# Patient Record
Sex: Female | Born: 1970 | Race: Black or African American | Hispanic: No | Marital: Single | Smoking: Never smoker
Health system: Southern US, Community
[De-identification: ages and names within clinical notes are randomized; demographics above are authoritative.]

## PROBLEM LIST (undated history)

## (undated) DIAGNOSIS — G8929 Other chronic pain: Secondary | ICD-10-CM

## (undated) DIAGNOSIS — M25562 Pain in left knee: Secondary | ICD-10-CM

## (undated) DIAGNOSIS — R413 Other amnesia: Secondary | ICD-10-CM

## (undated) DIAGNOSIS — E785 Hyperlipidemia, unspecified: Secondary | ICD-10-CM

## (undated) DIAGNOSIS — Z6838 Body mass index (BMI) 38.0-38.9, adult: Secondary | ICD-10-CM

## (undated) DIAGNOSIS — M25571 Pain in right ankle and joints of right foot: Secondary | ICD-10-CM

## (undated) DIAGNOSIS — N924 Excessive bleeding in the premenopausal period: Secondary | ICD-10-CM

## (undated) DIAGNOSIS — I10 Essential (primary) hypertension: Secondary | ICD-10-CM

## (undated) DIAGNOSIS — Z780 Asymptomatic menopausal state: Secondary | ICD-10-CM

## (undated) HISTORY — DX: Pain in right ankle and joints of right foot: M25.571

## (undated) HISTORY — DX: Excessive bleeding in the premenopausal period: N92.4

## (undated) HISTORY — DX: Hyperlipidemia, unspecified: E78.5

## (undated) HISTORY — DX: Asymptomatic menopausal state: Z78.0

## (undated) HISTORY — DX: Other chronic pain: G89.29

## (undated) HISTORY — DX: Pain in left knee: M25.562

## (undated) HISTORY — DX: Essential (primary) hypertension: I10

## (undated) HISTORY — DX: Other amnesia: R41.3

## (undated) HISTORY — DX: Body mass index (bmi) 38.0-38.9, adult: Z68.38

---

## 2014-04-07 DIAGNOSIS — Z6838 Body mass index (BMI) 38.0-38.9, adult: Secondary | ICD-10-CM

## 2014-04-07 DIAGNOSIS — E782 Mixed hyperlipidemia: Secondary | ICD-10-CM | POA: Insufficient documentation

## 2014-04-07 DIAGNOSIS — N924 Excessive bleeding in the premenopausal period: Secondary | ICD-10-CM

## 2014-04-07 DIAGNOSIS — I10 Essential (primary) hypertension: Secondary | ICD-10-CM | POA: Insufficient documentation

## 2014-04-07 HISTORY — DX: Body mass index (BMI) 38.0-38.9, adult: Z68.38

## 2014-04-07 HISTORY — DX: Excessive bleeding in the premenopausal period: N92.4

## 2014-04-16 ENCOUNTER — Ambulatory Visit
Admit: 2014-04-16 | Disposition: A | Discharge status: Home / Self Care | Payer: Self-pay | Attending: Internal Medicine | Admitting: Internal Medicine

## 2014-09-24 DIAGNOSIS — L700 Acne vulgaris: Secondary | ICD-10-CM | POA: Insufficient documentation

## 2016-03-03 ENCOUNTER — Other Ambulatory Visit: Payer: Self-pay | Admitting: Internal Medicine

## 2016-03-03 DIAGNOSIS — R413 Other amnesia: Secondary | ICD-10-CM

## 2016-03-03 DIAGNOSIS — Z1239 Encounter for other screening for malignant neoplasm of breast: Secondary | ICD-10-CM

## 2016-03-03 DIAGNOSIS — N951 Menopausal and female climacteric states: Secondary | ICD-10-CM | POA: Insufficient documentation

## 2016-03-03 HISTORY — DX: Other amnesia: R41.3

## 2016-03-03 LAB — HM PAP SMEAR: HM Pap smear: NEGATIVE

## 2016-03-31 ENCOUNTER — Ambulatory Visit: Payer: Self-pay

## 2016-04-13 ENCOUNTER — Ambulatory Visit
Admission: RE | Admit: 2016-04-13 | Discharge: 2016-04-13 | Disposition: A | Discharge status: Home / Self Care | Payer: BLUE CROSS/BLUE SHIELD | Source: Ambulatory Visit | Attending: Internal Medicine | Admitting: Internal Medicine

## 2016-04-13 ENCOUNTER — Encounter: Payer: Self-pay | Admitting: Radiology

## 2016-04-13 DIAGNOSIS — Z1231 Encounter for screening mammogram for malignant neoplasm of breast: Secondary | ICD-10-CM | POA: Insufficient documentation

## 2016-04-13 DIAGNOSIS — Z1239 Encounter for other screening for malignant neoplasm of breast: Secondary | ICD-10-CM

## 2017-05-23 DIAGNOSIS — Z780 Asymptomatic menopausal state: Secondary | ICD-10-CM

## 2017-05-23 HISTORY — DX: Asymptomatic menopausal state: Z78.0

## 2017-05-23 LAB — TSH: TSH: 1.12 (ref 0.41–5.90)

## 2017-05-23 LAB — LIPID PANEL
CHOLESTEROL: 185 (ref 0–200)
HDL: 50 (ref 35–70)
LDL CALC: 115
Triglycerides: 102 (ref 40–160)

## 2017-05-23 LAB — BASIC METABOLIC PANEL: Glucose: 90

## 2017-05-30 ENCOUNTER — Other Ambulatory Visit: Payer: Self-pay | Admitting: Internal Medicine

## 2017-05-30 DIAGNOSIS — E269 Hyperaldosteronism, unspecified: Secondary | ICD-10-CM

## 2017-06-12 ENCOUNTER — Ambulatory Visit: Admission: RE | Admit: 2017-06-12 | Payer: BC Managed Care – PPO | Source: Ambulatory Visit

## 2017-06-13 LAB — HM DEXA SCAN: HM DEXA SCAN: NORMAL

## 2018-03-28 ENCOUNTER — Other Ambulatory Visit: Payer: Self-pay

## 2018-03-28 ENCOUNTER — Ambulatory Visit: Payer: BC Managed Care – PPO | Admitting: Family Medicine

## 2018-03-28 ENCOUNTER — Encounter: Payer: Self-pay | Admitting: Family Medicine

## 2018-03-28 VITALS — BP 148/90 | HR 96 | Temp 98.3°F | Resp 16 | Ht 68.0 in | Wt 243.3 lb

## 2018-03-28 DIAGNOSIS — Z1239 Encounter for other screening for malignant neoplasm of breast: Secondary | ICD-10-CM

## 2018-03-28 DIAGNOSIS — Z113 Encounter for screening for infections with a predominantly sexual mode of transmission: Secondary | ICD-10-CM

## 2018-03-28 DIAGNOSIS — Z114 Encounter for screening for human immunodeficiency virus [HIV]: Secondary | ICD-10-CM

## 2018-03-28 DIAGNOSIS — Z23 Encounter for immunization: Secondary | ICD-10-CM

## 2018-03-28 DIAGNOSIS — Z131 Encounter for screening for diabetes mellitus: Secondary | ICD-10-CM

## 2018-03-28 DIAGNOSIS — E782 Mixed hyperlipidemia: Secondary | ICD-10-CM

## 2018-03-28 DIAGNOSIS — R799 Abnormal finding of blood chemistry, unspecified: Secondary | ICD-10-CM

## 2018-03-28 DIAGNOSIS — Z780 Asymptomatic menopausal state: Secondary | ICD-10-CM

## 2018-03-28 DIAGNOSIS — F341 Dysthymic disorder: Secondary | ICD-10-CM

## 2018-03-28 DIAGNOSIS — R7989 Other specified abnormal findings of blood chemistry: Secondary | ICD-10-CM

## 2018-03-28 DIAGNOSIS — E669 Obesity, unspecified: Secondary | ICD-10-CM

## 2018-03-28 DIAGNOSIS — I10 Essential (primary) hypertension: Secondary | ICD-10-CM

## 2018-03-28 MED ORDER — AMLODIPINE BESYLATE 5 MG PO TABS: 5.0000 mg | ORAL_TABLET | Freq: Every day | ORAL | 0 refills | Status: DC

## 2018-03-28 MED ORDER — SPIRONOLACTONE 25 MG PO TABS: 25.0000 mg | ORAL_TABLET | Freq: Every day | ORAL | 0 refills | Status: DC

## 2018-03-28 NOTE — Progress Notes (Signed)
Name: Cristina Bridges   MRN: [id number]    DOB: September 01, 1970   Date:03/28/2018       Progress Note  Subjective  Chief Complaint  Chief Complaint  Patient presents with  . Establish Care  . Hypertension    Denies any symptoms    HPI  New patient: used to see Dr. Sedonia Small at [facility] but had to switch providers because of new insurance  Dysthymia: she has post-partum depression but otherwise not problems with mood and never took medications for depression. She states not happy with her current relationship. She worries that he is not being faithful. They have been dating for the past 4 years but he moved in with her about 6 months ago and he is always gone, it makes her feel insecure and has affecting her mood, she has noticed that she is sad, eating more, gaining weight , phq 9 is high. Discussed counseling and talk to him.   Acne vulgaris: used to see Dermatologist but missed appointment, out of spirnolactone and also creams, discussed better diet  HTN: she skips medications because it causes fatigue and dizziness, we will decrease dose of norvasc, reviewed records and renin/aldosterone ratio was abnormal last year, bp was up and CT ordered but not done because it was not covered by insurance. We will recheck levels. No chest pain or palpitation.   Obesity: she has not been active, eating fast food, drinking sodas, sweet tea . She will change her diet and start exercising again.   Dyslipidemia: not on medication, we will recheck labs.    Patient Active Problem List   Diagnosis Date Noted  . Postmenopausal 05/23/2017  . Hot flashes, menopausal 03/03/2016  . Acne vulgaris 09/24/2014  . Benign essential hypertension 04/07/2014  . Body mass index (BMI) of 38.0-38.9 in adult 04/07/2014  . Mixed hyperlipidemia 04/07/2014    Past Surgical History:  Procedure Laterality Date  . CESAREAN SECTION  05/29/2000   Age-12    Family History  Problem Relation Age of Onset  . Diabetes  Mellitus II Mother   . Hypertension Mother   . Bone cancer Father        Remission  . Prostate cancer Father        Remission  . Hypertension Sister   . Cancer Paternal Grandmother   . Cancer Paternal Grandfather        Maybe Lung    Social History   Socioeconomic History  . Marital status: Media planner    Spouse name: Not on file  . Number of children: 1  . Years of education: Not on file  . Highest education level: Associate degree: academic program  Occupational History  . Occupation: Higher education careers adviser  . Financial resource strain: Not hard at all  . Food insecurity:    Worry: Never true    Inability: Never true  . Transportation needs:    Medical: No    Non-medical: No  Tobacco Use  . Smoking status: Never Smoker  . Smokeless tobacco: Never Used  Substance and Sexual Activity  . Alcohol use: Yes    Alcohol/week: 1.0 standard drinks    Types: 1 Glasses of wine per week    Comment: occasionally  . Drug use: Never  . Sexual activity: Yes    Partners: Male    Birth control/protection: Post-menopausal  Lifestyle  . Physical activity:    Days per week: 0 days    Minutes per session: 0 min  .  Stress: Only a little  Relationships  . Social connections:    Talks on phone: Three times a week    Gets together: Once a week    Attends religious service: More than 4 times per year    Active member of club or organization: No    Attends meetings of clubs or organizations: Never    Relationship status: Never married  . Intimate partner violence:    Fear of current or ex partner: No    Emotionally abused: No    Physically abused: No    Forced sexual activity: No  Other Topics Concern  . Not on file  Social History Narrative   She works at [employer] at [employer], 48 yo at home.    Boyfriend moved in with them 4 years ago and relationship is going down hill.      Current Outpatient Medications:  .  amLODipine (NORVASC) 10 MG tablet, Take by mouth., Disp: , Rfl:   .  tretinoin (RETIN-A) 0.025 % cream, nightly., Disp: , Rfl:  .  diclofenac (VOLTAREN) 75 MG EC tablet, Take by mouth., Disp: , Rfl:  .  felodipine (PLENDIL) 10 MG 24 hr tablet, Take by mouth., Disp: , Rfl:  .  spironolactone (ALDACTONE) 50 MG tablet, Take one tablet by mouth daily, Disp: , Rfl:   No Known Allergies  I personally reviewed active problem list, medication list, allergies, family history, social history with the patient/caregiver today.   ROS  Constitutional: Negative for fever or weight change.  Respiratory: Negative for cough and shortness of breath.   Cardiovascular: Negative for chest pain or palpitations.  Gastrointestinal: Negative for abdominal pain, no bowel changes.  Musculoskeletal: Negative for gait problem or joint swelling.  Skin: Negative for rash. Acne is worse  Neurological: Negative for dizziness or headache.  No other specific complaints in a complete review of systems (except as listed in HPI above).  Objective  Vitals:   03/28/18 0938  BP: (!) 148/90  Pulse: 96  Resp: 16  Temp: 98.3 F (36.8 C)  TempSrc: Oral  SpO2: 98%  Weight: 243 lb 4.8 oz (110.4 kg)  Height: 5\' 8"  (1.727 m)    Body mass index is 36.99 kg/m.  Physical Exam  Constitutional: Patient appears well-developed and well-nourished. Obese  No distress.  HEENT: head atraumatic, normocephalic, pupils equal and reactive to light, neck supple, throat within normal limits Cardiovascular: Normal rate, regular rhythm and normal heart sounds.  No murmur heard. No BLE edema. Pulmonary/Chest: Effort normal and breath sounds normal. No respiratory distress. Abdominal: Soft.  There is no tenderness. Psychiatric: Patient has a normal mood and affect. behavior is normal. Judgment and thought content normal.  PHQ2/9: Depression screen PHQ 2/9 03/28/2018  Decreased Interest 1  Down, Depressed, Hopeless 1  PHQ - 2 Score 2  Altered sleeping 2  Tired, decreased energy 3  Change in  appetite 3  Feeling bad or failure about yourself  0  Trouble concentrating 1  Moving slowly or fidgety/restless 0  Suicidal thoughts 0  PHQ-9 Score 11  Difficult doing work/chores Somewhat difficult   phq9   Fall Risk: Fall Risk  03/28/2018  Falls in the past year? 0  Number falls in past yr: 0  Injury with Fall? 0     Functional Status Survey: Is the patient deaf or have difficulty hearing?: No Does the patient have difficulty seeing, even when wearing glasses/contacts?: Yes(contacts) Does the patient have difficulty concentrating, remembering, or making decisions?: Yes(remembering) Does  the patient have difficulty walking or climbing stairs?: No Does the patient have difficulty dressing or bathing?: No Does the patient have difficulty doing errands alone such as visiting a doctor's office or shopping?: No    Assessment & Plan  1. Benign essential hypertension  - Urine Microalbumin w/creat. ratio - Aldosterone + renin activity w/ ratio - CBC with Differential/Platelet - COMPLETE METABOLIC PANEL WITH GFR - amLODipine (NORVASC) 5 MG tablet; Take 1 tablet (5 mg total) by mouth daily.  Dispense: 30 tablet; Refill: 0 - spironolactone (ALDACTONE) 25 MG tablet; Take 1 tablet (25 mg total) by mouth daily.  Dispense: 30 tablet; Refill: 0  2. Encounter for screening for HIV  - HIV Antibody (routine testing w rflx)  3. Flu vaccine need  - Flu Vaccine QUAD 36+ mos IM  4. Breast cancer screening  - MM 3D SCREEN BREAST BILATERAL  5. Obesity (BMI 35.0-39.9 without comorbidity)  Discussed with the patient the risk posed by an increased BMI. Discussed importance of portion control, calorie counting and at least 150 minutes of physical activity weekly. Avoid sweet beverages and drink more water. Eat at least 6 servings of fruit and vegetables daily   6. Mixed hyperlipidemia  - Lipid panel  7. Postmenopausal   8. 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 Diabetes mellitus screening  - Hemoglobin A1c  10. Routine screening for STI (sexually transmitted infection)  - Hepatitis panel, acute - RPR  11. Dysthymia  She will try talking to boyfriend and see if he stays home more otherwise she will ask him to leave her house   12. Morbid obesity (HCC)  Discussed with the patient the risk posed by an increased BMI. Discussed importance of portion control, calorie counting and at least 150 minutes of physical activity weekly. Avoid sweet beverages and drink more water. Eat at least 6 servings of fruit and vegetables daily

## 2018-04-02 ENCOUNTER — Other Ambulatory Visit: Payer: Self-pay | Admitting: Family Medicine

## 2018-04-02 ENCOUNTER — Encounter: Payer: Self-pay | Admitting: Family Medicine

## 2018-04-02 DIAGNOSIS — R7989 Other specified abnormal findings of blood chemistry: Secondary | ICD-10-CM

## 2018-04-02 DIAGNOSIS — R809 Proteinuria, unspecified: Secondary | ICD-10-CM | POA: Insufficient documentation

## 2018-04-02 DIAGNOSIS — I10 Essential (primary) hypertension: Secondary | ICD-10-CM

## 2018-04-02 DIAGNOSIS — R799 Abnormal finding of blood chemistry, unspecified: Secondary | ICD-10-CM

## 2018-04-02 LAB — ALDOSTERONE + RENIN ACTIVITY W/ RATIO
ALDO / PRA Ratio: 84.6 Ratio — ABNORMAL HIGH (ref 0.9–28.9)
Aldosterone: 11 ng/dL
RENIN ACTIVITY: 0.13 ng/mL/h — AB (ref 0.25–5.82)

## 2018-04-02 LAB — CBC WITH DIFFERENTIAL/PLATELET
ABSOLUTE MONOCYTES: 488 {cells}/uL (ref 200–950)
BASOS ABS: 21 {cells}/uL (ref 0–200)
BASOS PCT: 0.4 %
Eosinophils Absolute: 122 cells/uL (ref 15–500)
Eosinophils Relative: 2.3 %
HEMATOCRIT: 41.7 % (ref 35.0–45.0)
HEMOGLOBIN: 14 g/dL (ref 11.7–15.5)
Lymphs Abs: 2131 cells/uL (ref 850–3900)
MCH: 30.2 pg (ref 27.0–33.0)
MCHC: 33.6 g/dL (ref 32.0–36.0)
MCV: 90.1 fL (ref 80.0–100.0)
MPV: 11.5 fL (ref 7.5–12.5)
Monocytes Relative: 9.2 %
Neutro Abs: 2539 cells/uL (ref 1500–7800)
Neutrophils Relative %: 47.9 %
Platelets: 338 10*3/uL (ref 140–400)
RBC: 4.63 10*6/uL (ref 3.80–5.10)
RDW: 13.2 % (ref 11.0–15.0)
Total Lymphocyte: 40.2 %
WBC: 5.3 10*3/uL (ref 3.8–10.8)

## 2018-04-02 LAB — HEMOGLOBIN A1C
EAG (MMOL/L): 6.5 (calc)
Hgb A1c MFr Bld: 5.7 % of total Hgb — ABNORMAL HIGH (ref <5.7)
MEAN PLASMA GLUCOSE: 117 (calc)

## 2018-04-02 LAB — HEPATITIS PANEL, ACUTE
HEP B C IGM: NONREACTIVE
HEP B S AG: NONREACTIVE
Hep A IgM: NONREACTIVE
Hepatitis C Ab: NONREACTIVE
SIGNAL TO CUT-OFF: 0.06 (ref <1.00)

## 2018-04-02 LAB — LIPID PANEL
CHOLESTEROL: 174 mg/dL (ref <200)
HDL: 54 mg/dL (ref >=50)
LDL Cholesterol (Calc): 97 mg/dL (calc)
Non-HDL Cholesterol (Calc): 120 mg/dL (calc) (ref <130)
Total CHOL/HDL Ratio: 3.2 (calc) (ref <5.0)
Triglycerides: 125 mg/dL (ref <150)

## 2018-04-02 LAB — COMPLETE METABOLIC PANEL WITH GFR
AG Ratio: 1.7 (calc) (ref 1.0–2.5)
ALBUMIN MSPROF: 4.4 g/dL (ref 3.6–5.1)
ALKALINE PHOSPHATASE (APISO): 87 U/L (ref 31–125)
ALT: 22 U/L (ref 6–29)
AST: 24 U/L (ref 10–35)
BILIRUBIN TOTAL: 0.4 mg/dL (ref 0.2–1.2)
BUN: 10 mg/dL (ref 7–25)
CHLORIDE: 105 mmol/L (ref 98–110)
CO2: 28 mmol/L (ref 20–32)
Calcium: 9.5 mg/dL (ref 8.6–10.2)
Creat: 0.81 mg/dL (ref 0.50–1.10)
GFR, Est African American: 99 mL/min/{1.73_m2} (ref >=60)
GFR, Est Non African American: 85 mL/min/{1.73_m2} (ref >=60)
GLOBULIN: 2.6 g/dL (ref 1.9–3.7)
GLUCOSE: 84 mg/dL (ref 65–99)
Potassium: 4.2 mmol/L (ref 3.5–5.3)
Sodium: 142 mmol/L (ref 135–146)
Total Protein: 7 g/dL (ref 6.1–8.1)

## 2018-04-02 LAB — MICROALBUMIN / CREATININE URINE RATIO
CREATININE, URINE: 217 mg/dL (ref 20–275)
Microalb Creat Ratio: 42 mcg/mg creat — ABNORMAL HIGH (ref <30)
Microalb, Ur: 9.1 mg/dL

## 2018-04-02 LAB — RPR: RPR Ser Ql: NONREACTIVE

## 2018-04-02 LAB — HIV ANTIBODY (ROUTINE TESTING W REFLEX): HIV: NONREACTIVE

## 2018-04-09 ENCOUNTER — Telehealth: Payer: Self-pay | Admitting: Family Medicine

## 2018-04-09 NOTE — Telephone Encounter (Signed)
Copied from CRM [id number]. Topic: Quick Communication - See Telephone Encounter >> Apr 09, 2018  4:13 PM Jay Schlichter wrote: CRM for notification. See Telephone encounter for: 04/09/18. Ct Abdomen has been denied.  Peer to peer is needed.  Please call  [phone] Policy number is [phone] Closes on 04/10/18 at 4pm

## 2018-04-10 NOTE — Telephone Encounter (Signed)
Release ID #[id number]  Per the request of Dr. Alba Cory, information will be faxed to [facility] Medical Review Board at [phone] after she speaks with the review board.

## 2018-04-10 NOTE — Telephone Encounter (Signed)
Approved from 04/06/2018 till 10/02/2018  [phone]

## 2018-04-20 ENCOUNTER — Ambulatory Visit: Payer: BC Managed Care – PPO

## 2018-05-04 ENCOUNTER — Other Ambulatory Visit: Payer: Self-pay | Admitting: Family Medicine

## 2018-05-04 DIAGNOSIS — I10 Essential (primary) hypertension: Secondary | ICD-10-CM

## 2018-05-04 NOTE — Telephone Encounter (Signed)
Refill request for Hypertension medication:  Amlodipine 5 mg  Spironolactone 25 mg  Last office visit pertaining to hypertension: 03/28/2018  BP Readings from Last 3 Encounters:  03/28/18 (!) 148/90     Lab Results  Component Value Date   CREATININE 0.81 03/28/2018   BUN 10 03/28/2018   NA 142 03/28/2018   K 4.2 03/28/2018   CL 105 03/28/2018   CO2 28 03/28/2018   Follow-ups on file. 05/09/2018

## 2018-05-09 ENCOUNTER — Encounter: Payer: BC Managed Care – PPO | Admitting: Family Medicine

## 2018-05-25 ENCOUNTER — Other Ambulatory Visit: Payer: Self-pay

## 2018-05-25 ENCOUNTER — Ambulatory Visit (INDEPENDENT_AMBULATORY_CARE_PROVIDER_SITE_OTHER): Payer: BC Managed Care – PPO | Admitting: Family Medicine

## 2018-05-25 ENCOUNTER — Encounter: Payer: Self-pay | Admitting: Family Medicine

## 2018-05-25 VITALS — BP 126/78 | HR 88 | Temp 98.8°F | Resp 12 | Ht 69.0 in | Wt 248.6 lb

## 2018-05-25 DIAGNOSIS — R799 Abnormal finding of blood chemistry, unspecified: Secondary | ICD-10-CM

## 2018-05-25 DIAGNOSIS — I10 Essential (primary) hypertension: Secondary | ICD-10-CM | POA: Diagnosis not present

## 2018-05-25 DIAGNOSIS — Z1211 Encounter for screening for malignant neoplasm of colon: Secondary | ICD-10-CM

## 2018-05-25 DIAGNOSIS — Z01419 Encounter for gynecological examination (general) (routine) without abnormal findings: Secondary | ICD-10-CM

## 2018-05-25 DIAGNOSIS — R7989 Other specified abnormal findings of blood chemistry: Secondary | ICD-10-CM

## 2018-05-25 MED ORDER — AMLODIPINE BESYLATE 5 MG PO TABS: 5.0000 mg | ORAL_TABLET | Freq: Every day | ORAL | 0 refills | Status: DC

## 2018-05-25 MED ORDER — SPIRONOLACTONE 25 MG PO TABS: 25.0000 mg | ORAL_TABLET | Freq: Every day | ORAL | 0 refills | Status: DC

## 2018-05-25 NOTE — Progress Notes (Signed)
Name: Cristina Bridges   MRN: [id number]    DOB: 06/12/1970   Date:05/25/2018       Progress Note  Subjective  Chief Complaint  Chief Complaint  Patient presents with  . Annual Exam    HPI   Patient presents for annual CPE and follow up  HTN with proteinuria and elevated renin aldosterone level: unable to get CT secondary to cost, bp today is at goal, only side effects of spironolactone is feeling sleepy , she has been taking in the pm, no chest pain or palpitation.   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 She is doing better with her relationship since they talked , she is ready to go back to work, currently working remotely secondary to COVID-19 but is feeling much better and does not want medications  Obesity: and pre-diabetes she will try cutting down on carbohydrates and lose the 5 lbs she gained in the next 3 months   Diet: snacking more, likes sweet beverages and has been craving sweets in general  Exercise: not going to the gym, sitting most of the day, but she will try to go for walks 3 times daily   USPSTF grade A and B recommendations    Office Visit from 05/25/2018 in [facility]  AUDIT-C Score  1     Depression: Phq 9 is  positive Depression screen [facility] 2/9 05/25/2018 03/28/2018  Decreased Interest 0 1  Down, Depressed, Hopeless 1 1  PHQ - 2 Score 1 2  Altered sleeping 0 2  Tired, decreased energy 1 3  Change in appetite 2 3  Feeling bad or failure about yourself  0 0  Trouble concentrating 1 1  Moving slowly or fidgety/restless 0 0  Suicidal thoughts 0 0  PHQ-9 Score 5 11  Difficult doing work/chores Not difficult at all Somewhat difficult   Hypertension: BP Readings from Last 3 Encounters:  05/25/18 126/78  03/28/18 (!) 148/90   Obesity: Wt Readings from Last 3 Encounters:  05/25/18 248 lb 9.6 oz (112.8 kg)  03/28/18 243 lb 4.8 oz (110.4 kg)   BMI  Readings from Last 3 Encounters:  05/25/18 36.71 kg/m  03/28/18 36.99 kg/m    Hep C Screening: negative  STD testing and prevention (HIV/chl/gon/syphilis): recently done  Intimate partner violence: negative screen  Sexual History/Pain during Intercourse: same sexual partner for over 3 years, no pain  Menstrual History/LMP/Abnormal Bleeding: discussed post-menopausal bleeding  Incontinence Symptoms: none   Advanced Care Planning: A voluntary discussion about advance care planning including the explanation and discussion of advance directives.  Discussed health care proxy and Living will, and the patient was able to identify a health care proxy as mother   Patient does not have a living will at present time. If patient does have living will, I have requested they bring this to the clinic to be scanned in to their chart.  Breast cancer: she will call and schedule it  BRCA gene screening: N/A Cervical cancer screening: repeat pap in 2021   Osteoporosis Screening:  HM Dexa Scan  Date Value Ref Range Status  06/13/2017 Normal  Final    Lipids:  Lab Results  Component Value Date   CHOL 174 03/28/2018   CHOL 185 05/23/2017   Lab Results  Component Value Date   HDL 54 03/28/2018   HDL 50 05/23/2017   Lab Results  Component Value Date   LDLCALC 97 03/28/2018  [place] 115 05/23/2017   Lab Results  Component Value Date   TRIG 125 03/28/2018   TRIG 102 05/23/2017   Lab Results  Component Value Date   CHOLHDL 3.2 03/28/2018   No results found for: LDLDIRECT  Glucose:  Glucose, Bld  Date Value Ref Range Status  03/28/2018 84 65 - 99 mg/dL Final    Comment:    .            Fasting reference interval .     Skin cancer: discussed atypical lesions Colorectal cancer: scheduled  Lung cancer:   Low Dose CT Chest recommended if Age 59-80 years, 30 pack-year currently smoking OR have quit w/in 15years. Patient does not qualify.   ECG: 06/14/2016 at [facility]    Patient Active Problem List   Diagnosis Date Noted  . Microalbuminuria 04/02/2018  . Morbid obesity ([place]) 03/28/2018  . Postmenopausal 05/23/2017  . Hot flashes, menopausal 03/03/2016  . Acne vulgaris 09/24/2014  . Benign essential hypertension 04/07/2014  . Body mass index (BMI) of 38.0-38.9 in adult 04/07/2014  . Mixed hyperlipidemia 04/07/2014    Past Surgical History:  Procedure Laterality Date  . CESAREAN SECTION  05/29/2000   Age-59    Family History  Problem Relation Age of Onset  . Diabetes Mellitus II Mother   . Hypertension Mother   . Bone cancer Father        Remission  . Prostate cancer Father        Remission  . Hypertension Sister   . Cancer Paternal Grandmother   . Cancer Paternal Grandfather        Maybe Lung    Social History   Socioeconomic History  . Marital status: Soil scientist    Spouse name: Not on file  . Number of children: 1  . Years of education: Not on file  . Highest education level: Associate degree: academic program  Occupational History  . Occupation: Architectural technologist  . Financial resource strain: Not hard at all  . Food insecurity:    Worry: Never true    Inability: Never true  . Transportation needs:    Medical: No    Non-medical: No  Tobacco Use  . Smoking status: Never Smoker  . Smokeless tobacco: Never Used  Substance and Sexual Activity  . Alcohol use: Yes    Alcohol/week: 1.0 standard drinks    Types: 1 Glasses of wine per week    Comment: occasionally  . Drug use: Never  . Sexual activity: Yes    Partners: Male    Birth control/protection: Post-menopausal  Lifestyle  . Physical activity:    Days per week: 0 days    Minutes per session: 0 min  . Stress: Only a little  Relationships  . Social connections:    Talks on phone: Three times a week    Gets together: Once a week    Attends religious service: More than 4 times per year    Active member of club or organization: No    Attends meetings of  clubs or organizations: Never    Relationship status: Never married  . Intimate partner violence:    Fear of current or ex partner: No    Emotionally abused: No    Physically abused: No    Forced sexual activity: No  Other Topics Concern  . Not on file  Social History Narrative   She works at [employer] at [employer], 40 yo at home.    Boyfriend  moved in with them 4 years ago and relationship is going down hill.      Current Outpatient Medications:  .  amLODipine (NORVASC) 5 MG tablet, TAKE 1 TABLET(5 MG) BY MOUTH DAILY, Disp: 30 tablet, Rfl: 0 .  spironolactone (ALDACTONE) 25 MG tablet, TAKE 1 TABLET(25 MG) BY MOUTH DAILY, Disp: 30 tablet, Rfl: 0 .  tretinoin (RETIN-A) 0.025 % cream, nightly., Disp: , Rfl:   No Known Allergies   ROS  Constitutional: Negative for fever, positive for mild  weight change.  Respiratory: Negative for cough and shortness of breath.   Cardiovascular: Negative for chest pain or palpitations.  Gastrointestinal: Negative for abdominal pain, no bowel changes.  Musculoskeletal: Negative for gait problem or joint swelling.  Skin: Negative for rash.  Neurological: Negative for dizziness or headache.  No other specific complaints in a complete review of systems (except as listed in HPI above).  Objective  Vitals:   05/25/18 1423  BP: 126/78  Pulse: 88  Resp: 12  Temp: 98.8 F (37.1 C)  TempSrc: Oral  SpO2: 95%  Weight: 248 lb 9.6 oz (112.8 kg)  Height: _0  (1.753 m)    Body mass index is 36.71 kg/m.  Physical Exam  Constitutional: Patient appears well-developed and well-nourished. No distress.  HENT: Head: Normocephalic and atraumatic. Ears: B TMs ok, no erythema or effusion; Nose: Nose normal. Mouth/Throat: Oropharynx is clear and moist. No oropharyngeal exudate.  Eyes: Conjunctivae and EOM are normal. Pupils are equal, round, and reactive to light. No scleral icterus.  Neck: Normal range of motion. Neck supple. No JVD present. No thyromegaly  present.  Cardiovascular: Normal rate, regular rhythm and normal heart sounds.  No murmur heard. No BLE edema. Pulmonary/Chest: Effort normal and breath sounds normal. No respiratory distress. Abdominal: Soft. Bowel sounds are normal, no distension. There is no tenderness. no masses Breast: no lumps or masses, no nipple discharge or rashes FEMALE GENITALIA:  Not done RECTAL: not done  Musculoskeletal: Normal range of motion, no joint effusions. No gross deformities Neurological: he is alert and oriented to person, place, and time. No cranial nerve deficit. Coordination, balance, strength, speech and gait are normal.  Skin: Skin is warm and dry. No rash noted. No erythema.  Psychiatric: Patient has a normal mood and affect. behavior is normal. Judgment and thought content normal.    PHQ2/9: Depression screen [facility] 2/9 05/25/2018 03/28/2018  Decreased Interest 0 1  Down, Depressed, Hopeless 1 1  PHQ - 2 Score 1 2  Altered sleeping 0 2  Tired, decreased energy 1 3  Change in appetite 2 3  Feeling bad or failure about yourself  0 0  Trouble concentrating 1 1  Moving slowly or fidgety/restless 0 0  Suicidal thoughts 0 0  PHQ-9 Score 5 11  Difficult doing work/chores Not difficult at all Somewhat difficult     Fall Risk: Fall Risk  05/25/2018 03/28/2018  Falls in the past year? 0 0  Number falls in past yr: - 0  Injury with Fall? - 0     Functional Status Survey: Is the patient deaf or have difficulty hearing?: No Does the patient have difficulty seeing, even when wearing glasses/contacts?: No Does the patient have difficulty concentrating, remembering, or making decisions?: No Does the patient have difficulty walking or climbing stairs?: No Does the patient have difficulty dressing or bathing?: No Does the patient have difficulty doing errands alone such as visiting a doctor's office or shopping?: No  Assessment & Plan  1. Well woman exam   2. Colon cancer screening  -  Ambulatory referral to Gastroenterology  3. 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  She cannot afford CT scan, high deducible, explained it is to look for adrenal gland problems, adenoma or cancer  -USPSTF grade A and B recommendations reviewed with patient; age-appropriate recommendations, preventive care, screening tests, etc discussed and encouraged; healthy living encouraged; see AVS for patient education given to patient -Discussed importance of 150 minutes of physical activity weekly, eat two servings of fish weekly, eat one serving of tree nuts ( cashews, pistachios, pecans, almonds.Marland Kitchen) every other day, eat 6 servings of fruit/vegetables daily and drink plenty of water and avoid sweet beverages.

## 2018-05-25 NOTE — Patient Instructions (Signed)
Preventive Care 40-64 Years, Female Preventive care refers to lifestyle choices and visits with your health care provider that can promote health and wellness. What does preventive care include?   A yearly physical exam. This is also called an annual well check.  Dental exams once or twice a year.  Routine eye exams. Ask your health care provider how often you should have your eyes checked.  Personal lifestyle choices, including: ? Daily care of your teeth and gums. ? Regular physical activity. ? Eating a healthy diet. ? Avoiding tobacco and drug use. ? Limiting alcohol use. ? Practicing safe sex. ? Taking low-dose aspirin daily starting at age 50. ? Taking vitamin and mineral supplements as recommended by your health care provider. What happens during an annual well check? The services and screenings done by your health care provider during your annual well check will depend on your age, overall health, lifestyle risk factors, and family history of disease. Counseling Your health care provider may ask you questions about your:  Alcohol use.  Tobacco use.  Drug use.  Emotional well-being.  Home and relationship well-being.  Sexual activity.  Eating habits.  Work and work environment.  Method of birth control.  Menstrual cycle.  Pregnancy history. Screening You may have the following tests or measurements:  Height, weight, and BMI.  Blood pressure.  Lipid and cholesterol levels. These may be checked every 5 years, or more frequently if you are over 50 years old.  Skin check.  Lung cancer screening. You may have this screening every year starting at age 55 if you have a 30-pack-year history of smoking and currently smoke or have quit within the past 15 years.  Colorectal cancer screening. All adults should have this screening starting at age 50 and continuing until age 75. Your health care provider may recommend screening at age 45. You will have tests every  1-10 years, depending on your results and the type of screening test. People at increased risk should start screening at an earlier age. Screening tests may include: ? Guaiac-based fecal occult blood testing. ? Fecal immunochemical test (FIT). ? Stool DNA test. ? Virtual colonoscopy. ? Sigmoidoscopy. During this test, a flexible tube with a tiny camera (sigmoidoscope) is used to examine your rectum and lower colon. The sigmoidoscope is inserted through your anus into your rectum and lower colon. ? Colonoscopy. During this test, a long, thin, flexible tube with a tiny camera (colonoscope) is used to examine your entire colon and rectum.  Hepatitis C blood test.  Hepatitis B blood test.  Sexually transmitted disease (STD) testing.  Diabetes screening. This is done by checking your blood sugar (glucose) after you have not eaten for a while (fasting). You may have this done every 1-3 years.  Mammogram. This may be done every 1-2 years. Talk to your health care provider about when you should start having regular mammograms. This may depend on whether you have a family history of breast cancer.  BRCA-related cancer screening. This may be done if you have a family history of breast, ovarian, tubal, or peritoneal cancers.  Pelvic exam and Pap test. This may be done every 3 years starting at age 21. Starting at age 30, this may be done every 5 years if you have a Pap test in combination with an HPV test.  Bone density scan. This is done to screen for osteoporosis. You may have this scan if you are at high risk for osteoporosis. Discuss your test results, treatment options,   and if necessary, the need for more tests with your health care provider. Vaccines Your health care provider may recommend certain vaccines, such as:  Influenza vaccine. This is recommended every year.  Tetanus, diphtheria, and acellular pertussis (Tdap, Td) vaccine. You may need a Td booster every 10 years.  Varicella  vaccine. You may need this if you have not been vaccinated.  Zoster vaccine. You may need this after age 38.  Measles, mumps, and rubella (MMR) vaccine. You may need at least one dose of MMR if you were born in 1957 or later. You may also need a second dose.  Pneumococcal 13-valent conjugate (PCV13) vaccine. You may need this if you have certain conditions and were not previously vaccinated.  Pneumococcal polysaccharide (PPSV23) vaccine. You may need one or two doses if you smoke cigarettes or if you have certain conditions.  Meningococcal vaccine. You may need this if you have certain conditions.  Hepatitis A vaccine. You may need this if you have certain conditions or if you travel or work in places where you may be exposed to hepatitis A.  Hepatitis B vaccine. You may need this if you have certain conditions or if you travel or work in places where you may be exposed to hepatitis B.  Haemophilus influenzae type b (Hib) vaccine. You may need this if you have certain conditions. Talk to your health care provider about which screenings and vaccines you need and how often you need them. This information is not intended to replace advice given to you by your health care provider. Make sure you discuss any questions you have with your health care provider. Document Released: 01/23/2015 Document Revised: 02/16/2017 Document Reviewed: 10/28/2014 Elsevier Interactive Patient Education  2019 [employer].

## 2018-05-29 ENCOUNTER — Telehealth: Payer: Self-pay | Admitting: Gastroenterology

## 2018-05-29 NOTE — Telephone Encounter (Signed)
Patient called in & l/m on v/m wanting to schedule a colonoscopy.

## 2018-05-30 ENCOUNTER — Other Ambulatory Visit: Payer: Self-pay

## 2018-05-30 DIAGNOSIS — Z1211 Encounter for screening for malignant neoplasm of colon: Secondary | ICD-10-CM

## 2018-05-30 NOTE — Telephone Encounter (Signed)
Gastroenterology Pre-Procedure Review  Request Date: 06/30 Requesting Physician: Dr. Servando Snare  PATIENT REVIEW QUESTIONS: The patient responded to the following health history questions as indicated:    1. Are you having any GI issues? No 2. Do you have a personal history of Polyps?No 3. Do you have a family history of Colon Cancer or Polyps? No 4. Diabetes Mellitus? No 5. Joint replacements in the past 12 months?No 6. Major health problems in the past 3 months?No 7. Any artificial heart valves, MVP, or defibrillator?No    MEDICATIONS & ALLERGIES:    Patient reports the following regarding taking any anticoagulation/antiplatelet therapy:   Plavix, Coumadin, Eliquis, Xarelto, Lovenox, Pradaxa, Brilinta, or Effient? No Aspirin? No  Patient confirms/reports the following medications:  Current Outpatient Medications  Medication Sig Dispense Refill  . amLODipine (NORVASC) 5 MG tablet Take 1 tablet (5 mg total) by mouth daily. 90 tablet 0  . spironolactone (ALDACTONE) 25 MG tablet Take 1 tablet (25 mg total) by mouth daily. 90 tablet 0  . tretinoin (RETIN-A) 0.025 % cream nightly.     No current facility-administered medications for this visit.     Patient confirms/reports the following allergies:  No Known Allergies  No orders of the defined types were placed in this encounter.   AUTHORIZATION INFORMATION Primary Insurance: 1D#: Group #:  Secondary Insurance: 1D#: Group #:  SCHEDULE INFORMATION: Date: 07/10/18 Time: Location:[facility]

## 2018-06-28 ENCOUNTER — Telehealth: Payer: Self-pay | Admitting: Gastroenterology

## 2018-06-28 NOTE — Telephone Encounter (Signed)
Pt left vm she has a procedure on 07/10/18 and needs to reschedule for a later time

## 2018-06-28 NOTE — Telephone Encounter (Signed)
Returned patients call regarding canceling/rescheduling 07/10/18 colonoscopy with Dr. Allen Norris at [facility].  Patient states she will call back to schedule at a later time.  Kieth Brightly in Endoscopy has been informed to cancel.  Thanks [employer]

## 2018-07-10 ENCOUNTER — Ambulatory Visit: Admit: 2018-07-10 | Payer: BC Managed Care – PPO | Admitting: Gastroenterology

## 2018-07-10 SURGERY — COLONOSCOPY WITH PROPOFOL
Anesthesia: General

## 2018-07-12 ENCOUNTER — Ambulatory Visit
Admission: RE | Admit: 2018-07-12 | Discharge: 2018-07-12 | Disposition: A | Discharge status: Home / Self Care | Payer: BC Managed Care – PPO | Source: Ambulatory Visit | Attending: Family Medicine | Admitting: Family Medicine

## 2018-07-12 ENCOUNTER — Other Ambulatory Visit: Payer: Self-pay

## 2018-07-12 DIAGNOSIS — Z1231 Encounter for screening mammogram for malignant neoplasm of breast: Secondary | ICD-10-CM | POA: Insufficient documentation

## 2018-08-24 ENCOUNTER — Ambulatory Visit: Payer: BC Managed Care – PPO | Admitting: Family Medicine

## 2018-08-29 ENCOUNTER — Other Ambulatory Visit: Payer: Self-pay | Admitting: Family Medicine

## 2018-08-29 DIAGNOSIS — I10 Essential (primary) hypertension: Secondary | ICD-10-CM

## 2018-08-29 NOTE — Telephone Encounter (Signed)
Hypertension medication request: 05/25/2018   Last office visit pertaining to hypertension: Amlodipine and Spirolactone.   BP Readings from Last 3 Encounters:  05/25/18 126/78  03/28/18 (!) 148/90    Lab Results  Component Value Date   CREATININE 0.81 03/28/2018   BUN 10 03/28/2018   NA 142 03/28/2018   K 4.2 03/28/2018   CL 105 03/28/2018   CO2 28 03/28/2018     Follow up on 09/11/2018

## 2018-09-11 ENCOUNTER — Encounter: Payer: Self-pay | Admitting: Family Medicine

## 2018-09-11 ENCOUNTER — Ambulatory Visit (INDEPENDENT_AMBULATORY_CARE_PROVIDER_SITE_OTHER): Payer: BC Managed Care – PPO | Admitting: Family Medicine

## 2018-09-11 VITALS — Wt 241.0 lb

## 2018-09-11 DIAGNOSIS — F341 Dysthymic disorder: Secondary | ICD-10-CM

## 2018-09-11 DIAGNOSIS — R799 Abnormal finding of blood chemistry, unspecified: Secondary | ICD-10-CM

## 2018-09-11 DIAGNOSIS — I10 Essential (primary) hypertension: Secondary | ICD-10-CM

## 2018-09-11 DIAGNOSIS — E782 Mixed hyperlipidemia: Secondary | ICD-10-CM | POA: Diagnosis not present

## 2018-09-11 DIAGNOSIS — R7989 Other specified abnormal findings of blood chemistry: Secondary | ICD-10-CM

## 2018-09-11 DIAGNOSIS — E669 Obesity, unspecified: Secondary | ICD-10-CM

## 2018-09-11 NOTE — Progress Notes (Signed)
Name: Cristina Bridges   MRN: [id number]    DOB: [birth year]   Date:09/11/2018       Progress Note  Subjective  Chief Complaint  Chief Complaint  Patient presents with  . Medication Refill  . Hypertension  . Acne vulgaris  . Dyslipidemia  . Dysthymia    I connected with  Cristina Bridges  on 09/11/18 at 10:00 AM EDT by a video enabled telemedicine application and verified that I am speaking with the correct person using two identifiers.  I discussed the limitations of evaluation and management by telemedicine and the availability of in person appointments. The patient expressed understanding and agreed to proceed. Staff also discussed with the patient that there may be a patient responsible charge related to this service. 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 Advised her to return for CMA visit for bp check and flu vaccine in the next couple of weeks and reminded her to contact insurance to find out cost of CT scan and importance of having it done. Her medication list does not match ours, we will contact pharmacy to verify   Colonoscopy: she cancelled because of vacation, advised to re-schedule it   Dysthymia: she has post-partum depression but otherwise not problems with mood and never took medications for depression. She is doing better with her relationship since they talked , she is ready to go back to work, currently working three times a week from home, she states it has helped going back to the office  Obesity: and pre-diabetes she will try cutting down on carbohydrates and lost 7 lbs since last visit, she still splurges at times, not very consist but trying to do better, she is getting up earlier twice a week and walking for 30 minutes, advised to  increase to 5 days weekly. Also advised to prep healthy snacks the night before so she does not snack on junk food during the day   Dyslipidemia: discussed healthy diet, not on medication  Patient Active Problem List   Diagnosis Date Noted  . Microalbuminuria 04/02/2018  . Morbid obesity (HCC) 03/28/2018  . Postmenopausal 05/23/2017  . Hot flashes, menopausal 03/03/2016  . Acne vulgaris 09/24/2014  . Benign essential hypertension 04/07/2014  . Body mass index (BMI) of 38.0-38.9 in adult 04/07/2014  . Mixed hyperlipidemia 04/07/2014    Past Surgical History:  Procedure Laterality Date  . CESAREAN SECTION  05/29/2000   Age-48    Family History  Problem Relation Age of Onset  . Diabetes Mellitus II Mother   . Hypertension Mother   . Bone cancer Father        Remission  . Prostate cancer Father        Remission  . Hypertension Sister   . Cancer Paternal Grandmother   . Cancer Paternal Grandfather        Maybe Lung    Social History   Socioeconomic History  . Marital status: Media planner    Spouse name: Not on file  . Number of children: 1  . Years of education: Not on file  . Highest education level: Associate degree: academic program  Occupational History  . Occupation: Higher education careers adviser  . Financial resource strain: Not hard at all  .  Food insecurity    Worry: Never true    Inability: Never true  . Transportation needs    Medical: No    Non-medical: No  Tobacco Use  . Smoking status: Never Smoker  . Smokeless tobacco: Never Used  Substance and Sexual Activity  . Alcohol use: Yes    Alcohol/week: 1.0 standard drinks    Types: 1 Glasses of wine per week    Comment: occasionally  . Drug use: Never  . Sexual activity: Yes    Partners: Male    Birth control/protection: Post-menopausal  Lifestyle  . Physical activity    Days per week: 2 days    Minutes per session: 30 min  . Stress: Only a little  Relationships  . Social Herbalist  on phone: Three times a week    Gets together: Once a week    Attends religious service: More than 4 times per year    Active member of club or organization: No    Attends meetings of clubs or organizations: Never    Relationship status: Never married  . Intimate partner violence    Fear of current or ex partner: No    Emotionally abused: No    Physically abused: No    Forced sexual activity: No  Other Topics Concern  . Not on file  Social History Narrative   She works at [employer] at [employer], 26 yo at home.    Boyfriend moved in with them 4 years ago and relationship is going down hill.      Current Outpatient Medications:  .  felodipine (PLENDIL) 10 MG 24 hr tablet, Take 1 tablet by mouth daily., Disp: , Rfl:  .  spironolactone (ALDACTONE) 25 MG tablet, TAKE 1 TABLET(25 MG) BY MOUTH DAILY (Patient taking differently: Take 25 mg by mouth 2 (two) times daily. ), Disp: 90 tablet, Rfl: 0 .  tretinoin (RETIN-A) 0.025 % cream, nightly., Disp: , Rfl:   No Known Allergies  I personally reviewed active problem list, medication list, allergies, family history, social history with the patient/caregiver today.   ROS  Constitutional: Negative for fever or weight change.  Respiratory: Negative for cough and shortness of breath.   Cardiovascular: Negative for chest pain or palpitations.  Gastrointestinal: Negative for abdominal pain, no bowel changes.  Musculoskeletal: Negative for gait problem or joint swelling.  Skin: Negative for rash.  Neurological: Negative for dizziness or headache.  No other specific complaints in a complete review of systems (except as listed in HPI above).  Objective  Virtual encounter, vitals not obtained.  Body mass index is 35.59 kg/m.  Physical Exam  Awake, alert and oriented  PHQ2/9: Depression screen [facility] 2/9 09/11/2018 05/25/2018 03/28/2018  Decreased Interest 0 0 1  Down, Depressed, Hopeless 0 1 1  PHQ - 2 Score 0 1 2  Altered sleeping 0 0 2  Tired,  decreased energy 1 1 3   Change in appetite 1 2 3   Feeling bad or failure about yourself  0 0 0  Trouble concentrating 0 1 1  Moving slowly or fidgety/restless 0 0 0  Suicidal thoughts 0 0 0  PHQ-9 Score 2 5 11   Difficult doing work/chores Not difficult at all Not difficult at all Somewhat difficult   PHQ-2/9 Result is negative.    Fall Risk: Fall Risk  09/11/2018 05/25/2018 03/28/2018  Falls in the past year? 0 0 0  Number falls in past yr: 0 - 0  Injury with Fall? 0 -  0     Assessment & Plan  1. Benign essential hypertension  Needs to return for bp check   2. 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 Obesity (BMI 35.0-39.9 without comorbidity)  Discussed with the patient the risk posed by an increased BMI. Discussed importance of portion control, calorie counting and at least 150 minutes of physical activity weekly. Avoid sweet beverages and drink more water. Eat at least 6 servings of fruit and vegetables daily   4. Mixed hyperlipidemia  On life style modification   5. Dysthymia  Doing better    I discussed the assessment and treatment plan with the patient. The patient was provided an opportunity to ask questions and all were answered. The patient agreed with the plan and demonstrated an understanding of the instructions.  The patient was advised to call back or seek an in-person evaluation if the symptoms worsen or if the condition fails to improve as anticipated.  I provided 25  minutes of non-face-to-face time during this encounter.

## 2018-10-23 ENCOUNTER — Other Ambulatory Visit: Payer: Self-pay

## 2018-10-23 ENCOUNTER — Ambulatory Visit: Payer: BC Managed Care – PPO | Admitting: Family Medicine

## 2018-10-23 ENCOUNTER — Ambulatory Visit (INDEPENDENT_AMBULATORY_CARE_PROVIDER_SITE_OTHER): Payer: BC Managed Care – PPO

## 2018-10-23 VITALS — BP 130/90

## 2018-10-23 DIAGNOSIS — Z0130 Encounter for examination of blood pressure without abnormal findings: Secondary | ICD-10-CM

## 2018-10-23 DIAGNOSIS — Z23 Encounter for immunization: Secondary | ICD-10-CM

## 2018-10-23 NOTE — Progress Notes (Signed)
Patient is here for a blood pressure check. Patient denies chest pain, palpitations, shortness of breath or visual disturbances. Previous visit blood pressure was not obtained because it was a virtual visit. Today during nurse visit first check blood pressure was 140/100 and heart rate was 92. After resting for 10 minutes it was 130/90. She does take blood pressure medications as prescribed with no missed doses.

## 2018-11-14 ENCOUNTER — Other Ambulatory Visit: Payer: Self-pay | Admitting: Family Medicine

## 2018-11-14 DIAGNOSIS — I10 Essential (primary) hypertension: Secondary | ICD-10-CM

## 2018-11-14 NOTE — Telephone Encounter (Signed)
Requested medication (s) are due for refill today: no  Requested medication (s) are on the active medication list: no  Last refill:  05/04/2018  Future visit scheduled:   Notes to clinic:  Medication was discontinued    Requested Prescriptions  Pending Prescriptions Disp Refills   amLODipine (NORVASC) 5 MG tablet [Pharmacy Med Name: AMLODIPINE BESYLATE 5MG  TABLETS] 30 tablet 0    Sig: TAKE 1 TABLET(5 MG) BY MOUTH DAILY     Cardiovascular:  Calcium Channel Blockers Failed - 11/14/2018  3:30 AM      Failed - Last BP in normal range    BP Readings from Last 1 Encounters:  10/23/18 130/90         Passed - Valid encounter within last 6 months    Recent Outpatient Visits          2 months ago Benign essential hypertension   [facility] Steele Sizer, MD   5 months ago Well woman exam   [facility] Steele Sizer, MD   7 months ago Benign essential hypertension   [facility] Steele Sizer, MD

## 2018-11-15 ENCOUNTER — Other Ambulatory Visit: Payer: Self-pay

## 2018-11-15 NOTE — Telephone Encounter (Signed)
This medication was discontinued by you 05/2018. Please advise.

## 2018-11-15 NOTE — Telephone Encounter (Signed)
lvm for pt to schedule appt with Dr Ancil Boozer in 3 months. Also informed pt that prescription has been sent to pharmacy

## 2019-02-11 ENCOUNTER — Ambulatory Visit (INDEPENDENT_AMBULATORY_CARE_PROVIDER_SITE_OTHER): Payer: BC Managed Care – PPO | Admitting: Family Medicine

## 2019-02-11 ENCOUNTER — Other Ambulatory Visit: Payer: Self-pay

## 2019-02-11 ENCOUNTER — Encounter: Payer: Self-pay | Admitting: Family Medicine

## 2019-02-11 VITALS — BP 168/96 | HR 100 | Temp 97.0°F | Resp 14 | Ht 69.0 in | Wt 225.4 lb

## 2019-02-11 DIAGNOSIS — R7989 Other specified abnormal findings of blood chemistry: Secondary | ICD-10-CM

## 2019-02-11 DIAGNOSIS — I10 Essential (primary) hypertension: Secondary | ICD-10-CM

## 2019-02-11 DIAGNOSIS — F341 Dysthymic disorder: Secondary | ICD-10-CM

## 2019-02-11 DIAGNOSIS — R7303 Prediabetes: Secondary | ICD-10-CM

## 2019-02-11 DIAGNOSIS — Z1211 Encounter for screening for malignant neoplasm of colon: Secondary | ICD-10-CM | POA: Diagnosis not present

## 2019-02-11 DIAGNOSIS — E782 Mixed hyperlipidemia: Secondary | ICD-10-CM

## 2019-02-11 DIAGNOSIS — E669 Obesity, unspecified: Secondary | ICD-10-CM

## 2019-02-11 MED ORDER — AMLODIPINE BESYLATE 10 MG PO TABS: 10.0000 mg | ORAL_TABLET | Freq: Every day | ORAL | 1 refills | Status: DC

## 2019-02-11 MED ORDER — SPIRONOLACTONE 25 MG PO TABS: 12.5000 mg | ORAL_TABLET | Freq: Every day | ORAL | 0 refills | Status: DC

## 2019-02-11 NOTE — Progress Notes (Signed)
Name: Cristina Bridges   MRN: [id number]    DOB: 08-20-1970   Date:02/11/2019       Progress Note  Subjective  Chief Complaint  Chief Complaint  Patient presents with  . 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 . She denies  chest pain or palpitation  Colonoscopy: she cancelled because of vacation, we will send another referral today   Dysthymia: she has post-partum depression but otherwise not problems with mood and never took medications for depression.She states she ended her relationship. She is exercising more and taking care of herself.   Obesity: and pre-diabetes she will try cutting down on carbohydrates she lost 16 lbs since last visit. She has been cooking more at home, avoiding fast food and fried food   Dyslipidemia: discussed healthy diet, not on medication. We will recheck labs  Pre-diabetes: A1C was 5.7%, she is cutting down on carbs and is trying to get down under 200 lbs, Denies polyphagia, polydipsia or polyuria.    Patient Active Problem List   Diagnosis Date Noted  . Microalbuminuria 04/02/2018  . Postmenopausal 05/23/2017  . Hot flashes, menopausal 03/03/2016  . Acne vulgaris 09/24/2014  . Benign essential hypertension 04/07/2014  . Body mass index (BMI) of 38.0-38.9 in adult 04/07/2014  . Mixed hyperlipidemia 04/07/2014    Past Surgical History:  Procedure Laterality Date  . CESAREAN SECTION  05/29/2000   Age-61    Family History  Problem Relation Age of Onset  . Diabetes Mellitus II Mother   . Hypertension Mother   . Bone cancer Father        Remission  . Prostate cancer Father        Remission  . Hypertension Sister   .  Cancer Paternal Grandmother   . Cancer Paternal Grandfather        Maybe Lung     Current Outpatient Medications:  .  amLODipine (NORVASC) 10 MG tablet, Take 1 tablet (10 mg total) by mouth daily., Disp: 90 tablet, Rfl: 1 .  spironolactone (ALDACTONE) 25 MG tablet, Take 0.5-1 tablets (12.5-25 mg total) by mouth at bedtime., Disp: 90 tablet, Rfl: 0 .  tretinoin (RETIN-A) 0.025 % cream, nightly., Disp: , Rfl:   No Known Allergies  I personally reviewed active problem list, medication list, allergies, family history, social history with the patient/caregiver today.   ROS   Constitutional: Negative for fever , positive for weight change.  Respiratory: Negative for cough and shortness of breath.   Cardiovascular: Negative for chest pain or palpitations.  Gastrointestinal: Negative for abdominal pain, no bowel changes.  Musculoskeletal: Negative for gait problem or joint swelling.  Skin: Negative for rash.  Neurological: Negative for dizziness or headache.  No other specific complaints in a complete review of systems (except as listed in HPI above).   Objective  Vitals:   02/11/19 0830  BP: (!) 168/96  Pulse: 100  Resp: 14  Temp: (!) 97 F (36.1 C)  SpO2: 95%  Weight: 225 lb 6.4 oz (102.2 kg)  Height: 5\' 9"  (1.753 m)    Body mass index is 33.29 kg/m.  Physical Exam  Constitutional:  Patient appears well-developed and well-nourished. Obese No distress.  HEENT: head atraumatic, normocephalic, pupils equal and reactive to light, Cardiovascular: Normal rate, regular rhythm and normal heart sounds.  No murmur heard. No BLE edema. Pulmonary/Chest: Effort normal and breath sounds normal. No respiratory distress. Abdominal: Soft.  There is no tenderness. Psychiatric: Patient has a normal mood and affect. behavior is normal. Judgment and thought content normal.   PHQ2/9: Depression screen [facility] 2/9 02/11/2019 09/11/2018 05/25/2018 03/28/2018  Decreased Interest 1 0 0 1  Down,  Depressed, Hopeless 0 0 1 1  PHQ - 2 Score 1 0 1 2  Altered sleeping 1 0 0 2  Tired, decreased energy 0 1 1 3   Change in appetite 0 1 2 3   Feeling bad or failure about yourself  0 0 0 0  Trouble concentrating 0 0 1 1  Moving slowly or fidgety/restless 0 0 0 0  Suicidal thoughts 0 0 0 0  PHQ-9 Score 2 2 5 11   Difficult doing work/chores Not difficult at all Not difficult at all Not difficult at all Somewhat difficult    phq 9 is positive    Fall Risk: Fall Risk  02/11/2019 09/11/2018 05/25/2018 03/28/2018  Falls in the past year? 0 0 0 0  Number falls in past yr: 0 0 - 0  Injury with Fall? 0 0 - 0     Functional Status Survey: Is the patient deaf or have difficulty hearing?: No Does the patient have difficulty seeing, even when wearing glasses/contacts?: No Does the patient have difficulty concentrating, remembering, or making decisions?: No Does the patient have difficulty walking or climbing stairs?: No Does the patient have difficulty dressing or bathing?: No Does the patient have difficulty doing errands alone such as visiting a doctor's office or shopping?: No    Assessment & Plan   1. Benign essential hypertension  - CBC with Differential/Platelet - COMPLETE METABOLIC PANEL WITH GFR  2. Mixed hyperlipidemia  - Lipid panel  3. Colon cancer screening  - Ambulatory referral to Gastroenterology  4. 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 Obesity (BMI 35.0-39.9 without comorbidity)  Discussed with the patient the risk posed by an increased BMI. Discussed importance of portion control, calorie counting and at least 150 minutes of physical activity weekly. Avoid sweet beverages and drink more water. Eat at least 6 servings of fruit and vegetables daily   6. Pre-diabetes  - Hemoglobin A1c   7. Dysthymia  Mild, does not want to take medications

## 2019-03-06 ENCOUNTER — Encounter: Payer: Self-pay | Admitting: *Deleted

## 2019-03-18 ENCOUNTER — Other Ambulatory Visit: Payer: Self-pay

## 2019-03-18 ENCOUNTER — Ambulatory Visit (INDEPENDENT_AMBULATORY_CARE_PROVIDER_SITE_OTHER): Payer: BC Managed Care – PPO | Admitting: Family Medicine

## 2019-03-18 ENCOUNTER — Other Ambulatory Visit (HOSPITAL_COMMUNITY)
Admission: RE | Admit: 2019-03-18 | Discharge: 2019-03-18 | Disposition: A | Discharge status: Home / Self Care | Payer: BC Managed Care – PPO | Source: Ambulatory Visit | Attending: Family Medicine | Admitting: Family Medicine

## 2019-03-18 ENCOUNTER — Encounter: Payer: Self-pay | Admitting: Family Medicine

## 2019-03-18 VITALS — BP 132/90 | HR 91 | Temp 97.5°F | Resp 16 | Ht 68.0 in | Wt 223.4 lb

## 2019-03-18 DIAGNOSIS — Z124 Encounter for screening for malignant neoplasm of cervix: Secondary | ICD-10-CM | POA: Diagnosis not present

## 2019-03-18 DIAGNOSIS — Z1231 Encounter for screening mammogram for malignant neoplasm of breast: Secondary | ICD-10-CM | POA: Diagnosis not present

## 2019-03-18 DIAGNOSIS — Z0000 Encounter for general adult medical examination without abnormal findings: Secondary | ICD-10-CM | POA: Diagnosis not present

## 2019-03-18 DIAGNOSIS — I10 Essential (primary) hypertension: Secondary | ICD-10-CM | POA: Diagnosis not present

## 2019-03-18 DIAGNOSIS — Z113 Encounter for screening for infections with a predominantly sexual mode of transmission: Secondary | ICD-10-CM

## 2019-03-18 NOTE — Progress Notes (Signed)
Name: Cristina Bridges   MRN: [id number]    DOB: 02-21-1970   Date:03/18/2019       Progress Note  Subjective  Chief Complaint  Chief Complaint  Patient presents with  . Annual Exam    HPI  Patient presents for annual CPE.  Diet: cooking more at home, only eating out on the weekend Exercise: she will increase to 150 minutes per week   USPSTF grade A and B recommendations    Office Visit from 02/11/2019 in [facility]  AUDIT-C Score  0     Depression: Phq 9 is  negative Depression screen [facility] 2/9 03/18/2019 02/11/2019 09/11/2018 05/25/2018 03/28/2018  Decreased Interest 0 1 0 0 1  Down, Depressed, Hopeless 0 0 0 1 1  PHQ - 2 Score 0 1 0 1 2  Altered sleeping 1 1 0 0 2  Tired, decreased energy 0 0 1 1 3   Change in appetite 0 0 1 2 3   Feeling bad or failure about yourself  0 0 0 0 0  Trouble concentrating 0 0 0 1 1  Moving slowly or fidgety/restless 0 0 0 0 0  Suicidal thoughts 0 0 0 0 0  PHQ-9 Score 1 2 2 5 11   Difficult doing work/chores Not difficult at all Not difficult at all Not difficult at all Not difficult at all Somewhat difficult   Hypertension: BP Readings from Last 3 Encounters:  03/18/19 (!) 136/96  02/11/19 (!) 168/96  10/23/18 130/90   Obesity: Wt Readings from Last 3 Encounters:  03/18/19 223 lb 6.4 oz (101.3 kg)  02/11/19 225 lb 6.4 oz (102.2 kg)  09/11/18 241 lb (109.3 kg)   BMI Readings from Last 3 Encounters:  03/18/19 33.97 kg/m  02/11/19 33.29 kg/m  09/11/18 35.59 kg/m     Hep C Screening: today  STD testing and prevention (HIV/chl/gon/syphilis): today  Intimate partner violence: negative  Sexual History (Partners/Practices/Protection from [employer] hx STI/Pregnancy Plans): not currently sexually active, broke up with boyfriend about 6 months ago Pain during Intercourse: no history of pain during sex  Menstrual History/LMP/Abnormal Bleeding: no periods in about 2 years  Incontinence Symptoms: no symptoms   Breast cancer:  -  Last Mammogram: 07/2018  - BRCA gene screening: /N/A  Osteoporosis: Discussed high calcium and vitamin D supplementation, weight bearing exercises  Cervical cancer screening: today   Skin cancer: Discussed monitoring for atypical lesions  Colorectal cancer: she will contact GI to schedule it  Lung cancer:   Low Dose CT Chest recommended if Age 22-80 years, 30 pack-year currently smoking OR have quit w/in 15years. Patient does not qualify.   ECG: today   Advanced Care Planning: A voluntary discussion about advance care planning including the explanation and discussion of advance directives.  Discussed health care proxy and Living will, and the patient was able to identify a health care proxy as son .  Patient does not have a living will at present time.  Lipids: Lab Results  Component Value Date   CHOL 174 03/28/2018   CHOL 185 05/23/2017   Lab Results  Component Value Date   HDL 54 03/28/2018   HDL 50 05/23/2017   Lab Results  Component Value Date   LDLCALC 97 03/28/2018   LDLCALC 115 05/23/2017   Lab Results  Component Value Date   TRIG 125 03/28/2018   TRIG 102 05/23/2017   Lab Results  Component Value Date   CHOLHDL 3.2 03/28/2018   No results found for:  LDLDIRECT  Glucose: Glucose, Bld  Date Value Ref Range Status  03/28/2018 84 65 - 99 mg/dL Final    Comment:    .            Fasting reference interval .     Patient Active Problem List   Diagnosis Date Noted  . Microalbuminuria 04/02/2018  . Postmenopausal 05/23/2017  . Hot flashes, menopausal 03/03/2016  . Acne vulgaris 09/24/2014  . Benign essential hypertension 04/07/2014  . Body mass index (BMI) of 38.0-38.9 in adult 04/07/2014  . Mixed hyperlipidemia 04/07/2014    Past Surgical History:  Procedure Laterality Date  . CESAREAN SECTION  05/29/2000   Age-73    Family History  Problem Relation Age of Onset  . Diabetes Mellitus II Mother   . Hypertension Mother   . Bone cancer Father         Remission  . Prostate cancer Father        Remission  . Hypertension Sister   . Cancer Paternal Grandmother   . Cancer Paternal Grandfather        Maybe Lung    Social History   Socioeconomic History  . Marital status: Soil scientist    Spouse name: Not on file  . Number of children: 1  . Years of education: Not on file  . Highest education level: Associate degree: academic program  Occupational History  . Occupation: payroll   Tobacco Use  . Smoking status: Never Smoker  . Smokeless tobacco: Never Used  Substance and Sexual Activity  . Alcohol use: Yes    Alcohol/week: 1.0 standard drinks    Types: 1 Glasses of wine per week    Comment: occasionally  . Drug use: Never  . Sexual activity: Yes    Partners: Male    Birth control/protection: Post-menopausal  Other Topics Concern  . 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    No longer living with boyfriend   Social Determinants of Health   Financial Resource Strain: Low Risk   . Difficulty of Paying Living Expenses: Not hard at all  Food Insecurity: No Food Insecurity  . Worried About Charity fundraiser in the Last Year: Never true  . Ran Out of Food in the Last Year: Never true  Transportation Needs: No Transportation Needs  . Lack of Transportation (Medical): No  . Lack of Transportation (Non-Medical): No  Physical Activity: Insufficiently Active  . Days of Exercise per Week: 3 days  . Minutes of Exercise per Session: 20 min  Stress: No Stress Concern Present  . Feeling of Stress : Only a little  Social Connections: Somewhat Isolated  . Frequency of Communication with Friends and Family: Three times a week  . Frequency of Social Gatherings with Friends and Family: Once a week  . Attends Religious Services: More than 4 times per year  . Active Member of Clubs or Organizations: No  . Attends Archivist Meetings: Never  . Marital Status: Never  married  Intimate Partner Violence: Not At Risk  . Fear of Current or Ex-Partner: No  . Emotionally Abused: No  . Physically Abused: No  . Sexually Abused: No     Current Outpatient Medications:  .  amLODipine (NORVASC) 10 MG tablet, Take 1 tablet (10 mg total) by mouth daily., Disp: 90 tablet, Rfl: 1 .  spironolactone (ALDACTONE) 25 MG tablet, Take 0.5-1 tablets (12.5-25 mg  total) by mouth at bedtime., Disp: 90 tablet, Rfl: 0 .  tretinoin (RETIN-A) 0.025 % cream, nightly., Disp: , Rfl:   No Known Allergies   ROS  Constitutional: Negative for fever or weight change.  Respiratory: Negative for cough and shortness of breath.   Cardiovascular: Negative for chest pain or palpitations.  Gastrointestinal: Negative for abdominal pain, no bowel changes.  Musculoskeletal: Negative for gait problem or joint swelling.  Skin: Negative for rash.  Neurological: Negative for dizziness or headache.  No other specific complaints in a complete review of systems (except as listed in HPI above).  Objective  Vitals:   03/18/19 1046  BP: (!) 136/96  Pulse: 91  Resp: 16  Temp: (!) 97.5 F (36.4 C)  TempSrc: Temporal  SpO2: 98%  Weight: 223 lb 6.4 oz (101.3 kg)  Height: 5' 8"  (1.727 m)    Body mass index is 33.97 kg/m.  Physical Exam  Constitutional: Patient appears well-developed and obese . No distress.  HENT: Head: Normocephalic and atraumatic. Ears: B TMs ok, no erythema or effusion; Nose: Nose normal. Mouth/Throat: not done  Eyes: Conjunctivae and EOM are normal. Pupils are equal, round, and reactive to light. No scleral icterus.  Neck: Normal range of motion. Neck supple. No JVD present. No thyromegaly present.  Cardiovascular: Normal rate, regular rhythm and normal heart sounds.  No murmur heard. No BLE edema. Pulmonary/Chest: Effort normal and breath sounds normal. No respiratory distress. Abdominal: Soft. Bowel sounds are normal, no distension. There is no tenderness. no  masses Breast: no lumps or masses, no nipple discharge or rashes FEMALE GENITALIA:  External genitalia normal External urethra normal Vaginal vault normal without discharge or lesions Cervix normal without discharge or lesions Bimanual exam normal without masses RECTAL: not done Musculoskeletal: Normal range of motion, no joint effusions. No gross deformities Neurological: he is alert and oriented to person, place, and time. No cranial nerve deficit. Coordination, balance, strength, speech and gait are normal.  Skin: Skin is warm and dry. No rash noted. No erythema.  Psychiatric: Patient has a normal mood and affect. behavior is normal. Judgment and thought content normal.  Fall Risk: Fall Risk  03/18/2019 02/11/2019 09/11/2018 05/25/2018 03/28/2018  Falls in the past year? 0 0 0 0 0  Number falls in past yr: 0 0 0 - 0  Injury with Fall? 0 0 0 - 0     Functional Status Survey: Is the patient deaf or have difficulty hearing?: No Does the patient have difficulty seeing, even when wearing glasses/contacts?: No Does the patient have difficulty concentrating, remembering, or making decisions?: No Does the patient have difficulty walking or climbing stairs?: No Does the patient have difficulty dressing or bathing?: No Does the patient have difficulty doing errands alone such as visiting a doctor's office or shopping?: No   Assessment & Plan  1. Well adult exam  - Cytology - PAP  2. Cervical cancer screening  - Cytology - PAP  3. Benign essential hypertension  - EKG 12-Lead Advised to take a full dose of aldactone daily   4. Encounter for screening mammogram for malignant neoplasm of breast  - MM Digital Screening; Future  5. Routine screening for STI (sexually transmitted infection)  - RPR - HIV Antibody (routine testing w rflx)  -USPSTF grade A and B recommendations reviewed with patient; age-appropriate recommendations, preventive care, screening tests, etc discussed and  encouraged; healthy living encouraged; see AVS for patient education given to patient -Discussed importance of 150 minutes of  physical activity weekly, eat two servings of fish weekly, eat one serving of tree nuts ( cashews, pistachios, pecans, almonds.Marland Kitchen) every other day, eat 6 servings of fruit/vegetables daily and drink plenty of water and avoid sweet beverages.

## 2019-03-18 NOTE — Patient Instructions (Signed)

## 2019-03-21 LAB — CYTOLOGY - PAP
Comment: NEGATIVE
Diagnosis: NEGATIVE
High risk HPV: NEGATIVE

## 2019-03-24 LAB — ALDOSTERONE + RENIN ACTIVITY W/ RATIO
ALDO / PRA Ratio: 73.9 Ratio — ABNORMAL HIGH (ref 0.9–28.9)
Aldosterone: 17 ng/dL
Renin Activity: 0.23 ng/mL/h — ABNORMAL LOW (ref 0.25–5.82)

## 2019-03-24 LAB — LIPID PANEL
Cholesterol: 178 mg/dL (ref <200)
HDL: 50 mg/dL (ref >=50)
LDL Cholesterol (Calc): 105 mg/dL (calc) — ABNORMAL HIGH
Non-HDL Cholesterol (Calc): 128 mg/dL (calc) (ref <130)
Total CHOL/HDL Ratio: 3.6 (calc) (ref <5.0)
Triglycerides: 132 mg/dL (ref <150)

## 2019-03-24 LAB — CBC WITH DIFFERENTIAL/PLATELET
Absolute Monocytes: 523 cells/uL (ref 200–950)
Basophils Absolute: 22 cells/uL (ref 0–200)
Basophils Relative: 0.4 %
Eosinophils Absolute: 121 cells/uL (ref 15–500)
Eosinophils Relative: 2.2 %
HCT: 44.7 % (ref 35.0–45.0)
Hemoglobin: 14.8 g/dL (ref 11.7–15.5)
Lymphs Abs: 1920 cells/uL (ref 850–3900)
MCH: 29.9 pg (ref 27.0–33.0)
MCHC: 33.1 g/dL (ref 32.0–36.0)
MCV: 90.3 fL (ref 80.0–100.0)
MPV: 11.2 fL (ref 7.5–12.5)
Monocytes Relative: 9.5 %
Neutro Abs: 2915 cells/uL (ref 1500–7800)
Neutrophils Relative %: 53 %
Platelets: 322 10*3/uL (ref 140–400)
RBC: 4.95 10*6/uL (ref 3.80–5.10)
RDW: 13 % (ref 11.0–15.0)
Total Lymphocyte: 34.9 %
WBC: 5.5 10*3/uL (ref 3.8–10.8)

## 2019-03-24 LAB — COMPLETE METABOLIC PANEL WITH GFR
AG Ratio: 1.7 (calc) (ref 1.0–2.5)
ALT: 16 U/L (ref 6–29)
AST: 16 U/L (ref 10–35)
Albumin: 4.5 g/dL (ref 3.6–5.1)
Alkaline phosphatase (APISO): 65 U/L (ref 37–153)
BUN: 19 mg/dL (ref 7–25)
CO2: 29 mmol/L (ref 20–32)
Calcium: 10.1 mg/dL (ref 8.6–10.4)
Chloride: 103 mmol/L (ref 98–110)
Creat: 0.76 mg/dL (ref 0.50–1.05)
GFR, Est African American: 106 mL/min/{1.73_m2} (ref >=60)
GFR, Est Non African American: 91 mL/min/{1.73_m2} (ref >=60)
Globulin: 2.7 g/dL (calc) (ref 1.9–3.7)
Glucose, Bld: 82 mg/dL (ref 65–139)
Potassium: 4.7 mmol/L (ref 3.5–5.3)
Sodium: 140 mmol/L (ref 135–146)
Total Bilirubin: 0.7 mg/dL (ref 0.2–1.2)
Total Protein: 7.2 g/dL (ref 6.1–8.1)

## 2019-03-24 LAB — HEMOGLOBIN A1C
Hgb A1c MFr Bld: 5.6 % of total Hgb (ref <5.7)
Mean Plasma Glucose: 114 (calc)
eAG (mmol/L): 6.3 (calc)

## 2019-03-24 LAB — HIV ANTIBODY (ROUTINE TESTING W REFLEX): HIV 1&2 Ab, 4th Generation: NONREACTIVE

## 2019-03-24 LAB — RPR: RPR Ser Ql: NONREACTIVE

## 2019-05-13 ENCOUNTER — Ambulatory Visit: Payer: BC Managed Care – PPO | Admitting: Family Medicine

## 2019-06-17 ENCOUNTER — Ambulatory Visit: Payer: BC Managed Care – PPO | Admitting: Family Medicine

## 2019-07-16 ENCOUNTER — Ambulatory Visit
Admission: RE | Admit: 2019-07-16 | Discharge: 2019-07-16 | Disposition: A | Discharge status: Home / Self Care | Payer: BC Managed Care – PPO | Source: Ambulatory Visit | Attending: Family Medicine | Admitting: Family Medicine

## 2019-07-16 DIAGNOSIS — Z1231 Encounter for screening mammogram for malignant neoplasm of breast: Secondary | ICD-10-CM | POA: Diagnosis not present

## 2019-11-26 ENCOUNTER — Other Ambulatory Visit: Payer: Self-pay | Admitting: Family Medicine

## 2019-11-26 DIAGNOSIS — I10 Essential (primary) hypertension: Secondary | ICD-10-CM

## 2020-01-22 ENCOUNTER — Other Ambulatory Visit: Payer: Self-pay | Admitting: Family Medicine

## 2020-01-22 DIAGNOSIS — I10 Essential (primary) hypertension: Secondary | ICD-10-CM

## 2020-01-22 NOTE — Telephone Encounter (Signed)
lvm to schedule appt, dr Carlynn Purl is not able to refill medication without pt being seen. It also have to be in office appt

## 2020-07-07 ENCOUNTER — Encounter: Payer: Self-pay | Admitting: Unknown Physician Specialty

## 2020-07-07 ENCOUNTER — Other Ambulatory Visit: Payer: Self-pay

## 2020-07-07 ENCOUNTER — Ambulatory Visit (INDEPENDENT_AMBULATORY_CARE_PROVIDER_SITE_OTHER): Payer: BC Managed Care – PPO | Admitting: Unknown Physician Specialty

## 2020-07-07 VITALS — BP 158/102 | HR 84 | Temp 98.9°F | Resp 16 | Ht 68.0 in | Wt 228.8 lb

## 2020-07-07 DIAGNOSIS — R5383 Other fatigue: Secondary | ICD-10-CM

## 2020-07-07 DIAGNOSIS — I10 Essential (primary) hypertension: Secondary | ICD-10-CM

## 2020-07-07 DIAGNOSIS — Z6838 Body mass index (BMI) 38.0-38.9, adult: Secondary | ICD-10-CM

## 2020-07-07 MED ORDER — AMLODIPINE BESYLATE 5 MG PO TABS: 5.0000 mg | ORAL_TABLET | Freq: Every day | ORAL | 1 refills | Status: DC

## 2020-07-07 NOTE — Assessment & Plan Note (Addendum)
Not to goal and out of medications.  Will restart Amlodipine at 5 mg (was at 10 mg)  and check labs today.  Recheck in 1 month for medication adjustement

## 2020-07-07 NOTE — Progress Notes (Signed)
BP (!) 158/102   Pulse 84   Temp 98.9 F (37.2 C)   Resp 16   Ht 5\' 8"  (1.727 m)   Wt 228 lb 12.8 oz (103.8 kg)   SpO2 99%   BMI 34.79 kg/m    Subjective:    Patient ID: [name], female    DOB: 1970-10-28, 50 y.o.   MRN: [id number]  HPI: Cristina Bridges is a 50 y.o. female  Chief Complaint  Patient presents with   Follow-up   Hypertension   Medication Refill   Hypertension Not taking medications since she ran out and couldn't find the time to get back.  Not feeling well recently so decided to come in.   Average home BPs: Not checking   No problems or lightheadedness but states she is feeling dizzy No chest pain with exertion or shortness of breath No Edema  The 10-year ASCVD risk score 44 DC Jr., et al., 2013) is: 8.9%   Values used to calculate the score:     Age: 64 years     Sex: Female     Is Non-Hispanic African American: Yes     Diabetic: No     Tobacco smoker: No     Systolic Blood Pressure: 158 mmHg     Is BP treated: Yes     HDL Cholesterol: 50 mg/dL     Total Cholesterol: 178 mg/dL    Relevant past medical, surgical, family and social history reviewed and updated as indicated. Interim medical history since our last visit reviewed. Allergies and medications reviewed and updated.  Review of Systems  Constitutional:  Positive for fatigue.       Notes she is sleepy.  Does not snore   Per HPI unless specifically indicated above     Objective:    BP (!) 158/102   Pulse 84   Temp 98.9 F (37.2 C)   Resp 16   Ht 5\' 8"  (1.727 m)   Wt 228 lb 12.8 oz (103.8 kg)   SpO2 99%   BMI 34.79 kg/m   Wt Readings from Last 3 Encounters:  07/07/20 228 lb 12.8 oz (103.8 kg)  03/18/19 223 lb 6.4 oz (101.3 kg)  02/11/19 225 lb 6.4 oz (102.2 kg)    Physical Exam Constitutional:      General: She is not in acute distress.    Appearance: Normal appearance. She is well-developed.  HENT:     Head: Normocephalic and atraumatic.  Eyes:     General: Lids  are normal. No scleral icterus.       Right eye: No discharge.        Left eye: No discharge.     Conjunctiva/sclera: Conjunctivae normal.  Neck:     Vascular: No carotid bruit or JVD.  Cardiovascular:     Rate and Rhythm: Normal rate and regular rhythm.     Heart sounds: Normal heart sounds.  Pulmonary:     Effort: Pulmonary effort is normal. No respiratory distress.     Breath sounds: Normal breath sounds.  Abdominal:     Palpations: There is no hepatomegaly or splenomegaly.  Musculoskeletal:        General: Normal range of motion.     Cervical back: Normal range of motion and neck supple.  Skin:    General: Skin is warm and dry.     Coloration: Skin is not pale.     Findings: No rash.  Neurological:     Mental Status:  She is alert and oriented to person, place, and time.  Psychiatric:        Behavior: Behavior normal.        Thought Content: Thought content normal.        Judgment: Judgment normal.    Results for orders placed or performed in visit on 03/18/19  Cytology - PAP  Result Value Ref Range   High risk HPV Negative    Adequacy      Satisfactory for evaluation; transformation zone component PRESENT.   Diagnosis      - Negative for intraepithelial lesion or malignancy (NILM)   Comment Normal Reference Range HPV - Negative       Assessment & Plan:   Problem List Items Addressed This Visit       Unprioritized   Benign essential hypertension    Not to goal and out of medications.  Will restart Amlodipine at 5 mg (was at 10 mg)  and check labs today.  Recheck in 1 month for medication adjustement       Relevant Medications   amLODipine (NORVASC) 5 MG tablet   Other Relevant Orders   COMPLETE METABOLIC PANEL WITH GFR   Lipid panel   Body mass index (BMI) of 38.0-38.9 in adult - Primary    Check Hgb A1C, especially in light of new onset fatitgue       Relevant Orders   Hemoglobin A1c   Other Visit Diagnoses     Fatigue, unspecified type       New  concern of fatigue for several months now.     Relevant Orders   CBC with Differential/Platelet   TSH        Follow up plan: Return in about 4 weeks (around 08/04/2020).

## 2020-07-07 NOTE — Assessment & Plan Note (Signed)
Check Hgb A1C, especially in light of new onset fatitgue

## 2020-07-08 LAB — LIPID PANEL
Cholesterol: 190 mg/dL (ref <200)
HDL: 52 mg/dL (ref >=50)
LDL Cholesterol (Calc): 111 mg/dL (calc) — ABNORMAL HIGH
Non-HDL Cholesterol (Calc): 138 mg/dL (calc) — ABNORMAL HIGH (ref <130)
Total CHOL/HDL Ratio: 3.7 (calc) (ref <5.0)
Triglycerides: 157 mg/dL — ABNORMAL HIGH (ref <150)

## 2020-07-08 LAB — CBC WITH DIFFERENTIAL/PLATELET
Absolute Monocytes: 459 cells/uL (ref 200–950)
Basophils Absolute: 22 cells/uL (ref 0–200)
Basophils Relative: 0.4 %
Eosinophils Absolute: 119 cells/uL (ref 15–500)
Eosinophils Relative: 2.2 %
HCT: 41 % (ref 35.0–45.0)
Hemoglobin: 13.6 g/dL (ref 11.7–15.5)
Lymphs Abs: 2430 cells/uL (ref 850–3900)
MCH: 29.8 pg (ref 27.0–33.0)
MCHC: 33.2 g/dL (ref 32.0–36.0)
MCV: 89.7 fL (ref 80.0–100.0)
MPV: 11.2 fL (ref 7.5–12.5)
Monocytes Relative: 8.5 %
Neutro Abs: 2371 cells/uL (ref 1500–7800)
Neutrophils Relative %: 43.9 %
Platelets: 319 10*3/uL (ref 140–400)
RBC: 4.57 10*6/uL (ref 3.80–5.10)
RDW: 13.1 % (ref 11.0–15.0)
Total Lymphocyte: 45 %
WBC: 5.4 10*3/uL (ref 3.8–10.8)

## 2020-07-08 LAB — HEMOGLOBIN A1C
Hgb A1c MFr Bld: 5.6 % of total Hgb (ref <5.7)
Mean Plasma Glucose: 114 mg/dL
eAG (mmol/L): 6.3 mmol/L

## 2020-07-08 LAB — COMPLETE METABOLIC PANEL WITH GFR
AG Ratio: 1.8 (calc) (ref 1.0–2.5)
ALT: 15 U/L (ref 6–29)
AST: 17 U/L (ref 10–35)
Albumin: 4.3 g/dL (ref 3.6–5.1)
Alkaline phosphatase (APISO): 77 U/L (ref 37–153)
BUN: 11 mg/dL (ref 7–25)
CO2: 31 mmol/L (ref 20–32)
Calcium: 9.6 mg/dL (ref 8.6–10.4)
Chloride: 104 mmol/L (ref 98–110)
Creat: 0.63 mg/dL (ref 0.50–1.05)
GFR, Est African American: 120 mL/min/{1.73_m2} (ref >=60)
GFR, Est Non African American: 103 mL/min/{1.73_m2} (ref >=60)
Globulin: 2.4 g/dL (calc) (ref 1.9–3.7)
Glucose, Bld: 93 mg/dL (ref 65–99)
Potassium: 4.3 mmol/L (ref 3.5–5.3)
Sodium: 141 mmol/L (ref 135–146)
Total Bilirubin: 0.7 mg/dL (ref 0.2–1.2)
Total Protein: 6.7 g/dL (ref 6.1–8.1)

## 2020-07-08 LAB — TSH: TSH: 1.67 mIU/L

## 2020-07-09 ENCOUNTER — Encounter: Payer: Self-pay | Admitting: Family Medicine

## 2020-07-09 ENCOUNTER — Other Ambulatory Visit: Payer: Self-pay | Admitting: Unknown Physician Specialty

## 2020-07-09 MED ORDER — ATORVASTATIN CALCIUM 10 MG PO TABS: 10.0000 mg | ORAL_TABLET | Freq: Every day | ORAL | 3 refills | Status: DC

## 2020-07-22 ENCOUNTER — Encounter: Payer: Self-pay | Admitting: Unknown Physician Specialty

## 2020-07-22 ENCOUNTER — Other Ambulatory Visit: Payer: Self-pay | Admitting: Unknown Physician Specialty

## 2020-07-22 MED ORDER — PRAVASTATIN SODIUM 40 MG PO TABS: 40.0000 mg | ORAL_TABLET | Freq: Every day | ORAL | 0 refills | Status: DC

## 2020-08-11 NOTE — Progress Notes (Signed)
Name: Cristina Bridges   MRN: [id number]    DOB: [birth year]   Date:08/12/2020       Progress Note  Subjective  Chief Complaint  Follow Up  HPI  HTN with proteinuria and elevated renin aldosterone level: unable to get CT secondary to cost, but she has a better insurance and we will recheck labs and order CT if necessary.She stopped taking spironolactone because it made her sleepy, she is currently taking norvasc 5 mg and feels tired, bp improved but still not at goal, last urine micro was positive we will add low dose diovan and monitor. 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 She states she ended her relationship. She is currently working two jobs, feeling tired but still has energy to go out with her cousin    Obesity: weight is trending up, discussed life style modificaiton   Dyslipidemia: seen by Gabriel Cirri last month and was given statins , however it caused dizziness, she stopped medications on her own. Today her ASCVD risk is down to 5.7 % , we will keep bp under control and monitor   The 10-year ASCVD risk score Denman George DC Montez Hageman., et al., 2013) is: 5.7%   Values used to calculate the score:     Age: 50 years     Sex: Female     Is Non-Hispanic African American: Yes     Diabetic: No     Tobacco smoker: No     Systolic Blood Pressure: 140 mmHg     Is BP treated: Yes     HDL Cholesterol: 52 mg/dL     Total Cholesterol: 190 mg/dL   Pre-diabetes: K5L was 5.7%, she is cutting down on carbs and is trying to get down under 200 lbs, Denies polyphagia, polydipsia or polyuria.   Patient Active Problem List   Diagnosis Date Noted   Microalbuminuria 04/02/2018   Postmenopausal 05/23/2017   Hot flashes, menopausal 03/03/2016   Acne vulgaris 09/24/2014   Benign essential hypertension 04/07/2014   Body mass index (BMI) of 38.0-38.9 in adult 04/07/2014   Mixed  hyperlipidemia 04/07/2014    Past Surgical History:  Procedure Laterality Date   CESAREAN SECTION  05/29/2000   Age-58    Family History  Problem Relation Age of Onset   Diabetes Mellitus II Mother    Hypertension Mother    Bone cancer Father        Remission   Prostate cancer Father        Remission   Hypertension Sister    Cancer Paternal Grandmother    Cancer Paternal Grandfather        Maybe Lung    Social History   Tobacco Use   Smoking status: Never   Smokeless tobacco: Never  Substance Use Topics   Alcohol use: Yes    Alcohol/week: 1.0 standard drink    Types: 1 Glasses of wine per week    Comment: occasionally     Current Outpatient Medications:    amLODipine (NORVASC) 5 MG tablet, Take 1 tablet (5 mg total) by mouth daily., Disp: 90 tablet, Rfl: 1   pravastatin (PRAVACHOL) 40 MG tablet, Take 1 tablet (40 mg total) by mouth daily. (Patient not taking: Reported on 08/12/2020), Disp: 90 tablet, Rfl: 0   tretinoin (RETIN-A) 0.025 % cream, nightly. (Patient not taking: No sig reported), Disp: , Rfl:   No Known  Allergies  I personally reviewed active problem list, medication list, allergies, family history, social history, health maintenance with the patient/caregiver today.   ROS  Constitutional: Negative for fever or weight change.  Respiratory: Negative for cough and shortness of breath.   Cardiovascular: Negative for chest pain or palpitations.  Gastrointestinal: Negative for abdominal pain, no bowel changes.  Musculoskeletal: Negative for gait problem or joint swelling.  Skin: Negative for rash.  Neurological: Negative for dizziness or headache.  No other specific complaints in a complete review of systems (except as listed in HPI above).   Objective  Vitals:   08/12/20 1505  BP: 140/86  Pulse: 83  Resp: 16  Temp: 98.6 F (37 C)  SpO2: 99%  Weight: 229 lb (103.9 kg)  Height: 5\' 8"  (1.727 m)    Body mass index is 34.82 kg/m.  Physical  Exam  Constitutional: Patient appears well-developed and well-nourished. Obese  No distress.  HEENT: head atraumatic, normocephalic, pupils equal and reactive to light, neck supple Cardiovascular: Normal rate, regular rhythm and normal heart sounds.  No murmur heard. No BLE edema. Pulmonary/Chest: Effort normal and breath sounds normal. No respiratory distress. Abdominal: Soft.  There is no tenderness. Psychiatric: Patient has a normal mood and affect. behavior is normal. Judgment and thought content normal.   Recent Results (from the past 2160 hour(s))  CBC with Differential/Platelet     Status: None   Collection Time: 07/07/20  3:47 PM  Result Value Ref Range   WBC 5.4 3.8 - 10.8 Thousand/uL   RBC 4.57 3.80 - 5.10 Million/uL   Hemoglobin 13.6 11.7 - 15.5 g/dL   HCT 40.941.0 81.135.0 - 91.445.0 %   MCV 89.7 80.0 - 100.0 fL   MCH 29.8 27.0 - 33.0 pg   MCHC 33.2 32.0 - 36.0 g/dL   RDW 78.213.1 95.611.0 - 21.315.0 %   Platelets 319 140 - 400 Thousand/uL   MPV 11.2 7.5 - 12.5 fL   Neutro Abs 2,371 1,500 - 7,800 cells/uL   Lymphs Abs 2,430 850 - 3,900 cells/uL   Absolute Monocytes 459 200 - 950 cells/uL   Eosinophils Absolute 119 15 - 500 cells/uL   Basophils Absolute 22 0 - 200 cells/uL   Neutrophils Relative % 43.9 %   Total Lymphocyte 45.0 %   Monocytes Relative 8.5 %   Eosinophils Relative 2.2 %   Basophils Relative 0.4 %  COMPLETE METABOLIC PANEL WITH GFR     Status: None   Collection Time: 07/07/20  3:47 PM  Result Value Ref Range   Glucose, Bld 93 65 - 99 mg/dL    Comment: .            Fasting reference interval .    BUN 11 7 - 25 mg/dL   Creat 0.860.63 5.780.50 - 4.691.05 mg/dL    Comment: For patients >52 years of age, the reference limit for Creatinine is approximately 13% higher for people identified as African-American. .    GFR, Est Non African American 103 > OR = 60 mL/min/1.3573m2   GFR, Est African American 120 > OR = 60 mL/min/1.2973m2   BUN/Creatinine Ratio NOT APPLICABLE 6 - 22 (calc)    Sodium 141 135 - 146 mmol/L   Potassium 4.3 3.5 - 5.3 mmol/L   Chloride 104 98 - 110 mmol/L   CO2 31 20 - 32 mmol/L   Calcium 9.6 8.6 - 10.4 mg/dL   Total Protein 6.7 6.1 - 8.1 g/dL   Albumin 4.3 3.6 - 5.1 g/dL  Globulin 2.4 1.9 - 3.7 g/dL (calc)   AG Ratio 1.8 1.0 - 2.5 (calc)   Total Bilirubin 0.7 0.2 - 1.2 mg/dL   Alkaline phosphatase (APISO) 77 37 - 153 U/L   AST 17 10 - 35 U/L   ALT 15 6 - 29 U/L  TSH     Status: None   Collection Time: 07/07/20  3:47 PM  Result Value Ref Range   TSH 1.67 mIU/L    Comment:           Reference Range .           > or = 20 Years  0.40-4.50 .                Pregnancy Ranges           First trimester    0.26-2.66           Second trimester   0.55-2.73           Third trimester    0.43-2.91   Lipid panel     Status: Abnormal   Collection Time: 07/07/20  3:47 PM  Result Value Ref Range   Cholesterol 190 <200 mg/dL   HDL 52 > OR = 50 mg/dL   Triglycerides 462 (H) <150 mg/dL   LDL Cholesterol (Calc) 111 (H) mg/dL (calc)    Comment: Reference range: <100 . Desirable range <100 mg/dL for primary prevention;   <70 mg/dL for patients with CHD or diabetic patients  with > or = 2 CHD risk factors. Marland Kitchen LDL-C is now calculated using the Martin-Hopkins  calculation, which is a validated novel method providing  better accuracy than the Friedewald equation in the  estimation of LDL-C.  Horald Pollen et al. Lenox Ahr. 7035;009(38): 2061-2068  (http://education.[facility].com/faq/FAQ164)    Total CHOL/HDL Ratio 3.7 <5.0 (calc)   Non-HDL Cholesterol (Calc) 138 (H) <130 mg/dL (calc)    Comment: For patients with diabetes plus 1 major ASCVD risk  factor, treating to a non-HDL-C goal of <100 mg/dL  (LDL-C of <18 mg/dL) is considered a therapeutic  option.   Hemoglobin A1c     Status: None   Collection Time: 07/07/20  3:47 PM  Result Value Ref Range   Hgb A1c MFr Bld 5.6 <5.7 % of total Hgb    Comment: For the purpose of screening for the presence  of diabetes: . <5.7%       Consistent with the absence of diabetes 5.7-6.4%    Consistent with increased risk for diabetes             (prediabetes) > or =6.5%  Consistent with diabetes . This assay result is consistent with a decreased risk of diabetes. . Currently, no consensus exists regarding use of hemoglobin A1c for diagnosis of diabetes in children. . According to American Diabetes Association (ADA) guidelines, hemoglobin A1c <7.0% represents optimal control in non-pregnant diabetic patients. Different metrics may apply to specific patient populations.  Standards of Medical Care in Diabetes(ADA). .    Mean Plasma Glucose 114 mg/dL   eAG (mmol/L) 6.3 mmol/L      PHQ2/9: Depression screen [facility] 2/9 08/12/2020 07/07/2020 03/18/2019 02/11/2019 09/11/2018  Decreased Interest 1 1 0 1 0  Down, Depressed, Hopeless 0 0 0 0 0  PHQ - 2 Score 1 1 0 1 0  Altered sleeping 0 0 1 1 0  Tired, decreased energy 2 0 0 0 1  Change in appetite 0 0 0 0 1  Feeling bad or  failure about yourself  0 0 0 0 0  Trouble concentrating 0 0 0 0 0  Moving slowly or fidgety/restless 0 0 0 0 0  Suicidal thoughts 0 0 0 0 0  PHQ-9 Score 3 1 1 2 2   Difficult doing work/chores - Not difficult at all Not difficult at all Not difficult at all Not difficult at all    phq 9 is positive  Fall Risk: Fall Risk  08/12/2020 07/07/2020 03/18/2019 02/11/2019 09/11/2018  Falls in the past year? 0 0 0 0 0  Number falls in past yr: 0 0 0 0 0  Injury with Fall? 0 0 0 0 0      Functional Status Survey: Is the patient deaf or have difficulty hearing?: Yes Does the patient have difficulty seeing, even when wearing glasses/contacts?: Yes Does the patient have difficulty concentrating, remembering, or making decisions?: Yes Does the patient have difficulty walking or climbing stairs?: No Does the patient have difficulty dressing or bathing?: No Does the patient have difficulty doing errands alone such as visiting a doctor's office  or shopping?: No    Assessment & Plan  1. Benign essential hypertension  - valsartan (DIOVAN) 80 MG tablet; Take 1 tablet (80 mg total) by mouth daily.  Dispense: 30 tablet; Refill: 1  2. 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  Cannot afford CT but we will recheck level next time we do labs   3. Mixed hyperlipidemia   4. Pre-diabetes   5. Obesity (BMI 30.0-34.9)   6. Breast cancer screening by mammogram  Mammogram   7. Colon cancer screening  - Ambulatory referral to Gastroenterology  8. Need for shingles vaccine  - Varicella-zoster vaccine IM (Shingrix)

## 2020-08-12 ENCOUNTER — Encounter: Payer: Self-pay | Admitting: Family Medicine

## 2020-08-12 ENCOUNTER — Other Ambulatory Visit: Payer: Self-pay

## 2020-08-12 ENCOUNTER — Other Ambulatory Visit: Payer: Self-pay | Admitting: Family Medicine

## 2020-08-12 ENCOUNTER — Ambulatory Visit: Payer: BC Managed Care – PPO | Admitting: Family Medicine

## 2020-08-12 VITALS — BP 140/86 | HR 83 | Temp 98.6°F | Resp 16 | Ht 68.0 in | Wt 229.0 lb

## 2020-08-12 DIAGNOSIS — Z1231 Encounter for screening mammogram for malignant neoplasm of breast: Secondary | ICD-10-CM

## 2020-08-12 DIAGNOSIS — I10 Essential (primary) hypertension: Secondary | ICD-10-CM

## 2020-08-12 DIAGNOSIS — E782 Mixed hyperlipidemia: Secondary | ICD-10-CM

## 2020-08-12 DIAGNOSIS — R7989 Other specified abnormal findings of blood chemistry: Secondary | ICD-10-CM | POA: Diagnosis not present

## 2020-08-12 DIAGNOSIS — Z23 Encounter for immunization: Secondary | ICD-10-CM

## 2020-08-12 DIAGNOSIS — R7303 Prediabetes: Secondary | ICD-10-CM

## 2020-08-12 DIAGNOSIS — Z1211 Encounter for screening for malignant neoplasm of colon: Secondary | ICD-10-CM

## 2020-08-12 DIAGNOSIS — E669 Obesity, unspecified: Secondary | ICD-10-CM

## 2020-08-12 MED ORDER — VALSARTAN 80 MG PO TABS: 80.0000 mg | ORAL_TABLET | Freq: Every day | ORAL | 1 refills | Status: DC

## 2020-08-17 ENCOUNTER — Other Ambulatory Visit: Payer: Self-pay

## 2020-08-17 MED ORDER — CLENPIQ 10-3.5-12 MG-GM -GM/160ML PO SOLN: 320.0000 mL | ORAL | 0 refills | Status: DC

## 2020-09-15 ENCOUNTER — Ambulatory Visit
Admission: RE | Admit: 2020-09-15 | Payer: BC Managed Care – PPO | Source: Home / Self Care | Admitting: Gastroenterology

## 2020-09-15 ENCOUNTER — Encounter: Admission: RE | Payer: Self-pay | Source: Home / Self Care

## 2020-09-15 SURGERY — COLONOSCOPY
Anesthesia: General

## 2020-09-17 ENCOUNTER — Telehealth: Payer: Self-pay

## 2020-09-17 NOTE — Telephone Encounter (Signed)
Patient was called but was not able to leave her a voicemail. I will try to call her later on to reschedule her procedure.

## 2020-09-25 NOTE — Telephone Encounter (Signed)
Patient was called again and had to leave her another voicemail to call us back. I will send a message to her PCP to let her know that we have tried to reschedule her colonoscopy but patient is not reaching out to [place].

## 2020-11-09 ENCOUNTER — Encounter: Payer: Self-pay | Admitting: Family Medicine

## 2020-11-17 NOTE — Progress Notes (Signed)
Name: Cristina Bridges   MRN: [id number]    DOB: 1970/03/01   Date:11/18/2020       Progress Note  Subjective  Chief Complaint  Follow Up  HPI  HTN BP is at goal, no chest pain , palpitation or sob Her alsosterone/renin ration was elevated and also urine micro in the past. She cannot afford CT, states even colonoscopy is expensive   Dysthymia: she had post-partum depression but otherwise not problems with mood and never took medications for depression. She states she ended her relationship. She is currently working two jobs, feeling tired but still has energy to go out with her cousin Doing well     Morbid obesity  :BMI above 35 with co-morbidities such as HTN, dysthmia and dyslipidema . She asked me about Reginal Lutes but does not seem to be covered by insurance, we will try Saxenda, she denies family history of thyroid cancer or personal history of pancreatitis. Discussed possible side effects. Discussed pack lunch    Dyslipidemia: on life style modification only   The 10-year ASCVD risk score (Arnett DK, et al., 2019) is: 3.2%   Values used to calculate the score:     Age: 50 years     Sex: Female     Is Non-Hispanic African American: Yes     Diabetic: No     Tobacco smoker: No     Systolic Blood Pressure: 120 mmHg     Is BP treated: Yes     HDL Cholesterol: 52 mg/dL     Total Cholesterol: 190 mg/dL   Pre-diabetes: W0J is at goal at 5.6 % she is cutting down on carbs.  Denies polyphagia, polydipsia or polyuria.   Patient Active Problem List   Diagnosis Date Noted   Microalbuminuria 04/02/2018   Postmenopausal 05/23/2017   Hot flashes, menopausal 03/03/2016   Acne vulgaris 09/24/2014   Cystic acne 09/24/2014   Benign essential hypertension 04/07/2014   Body mass index (BMI) of 38.0-38.9 in adult 04/07/2014   Mixed hyperlipidemia 04/07/2014    Past Surgical History:  Procedure Laterality Date   CESAREAN SECTION  05/29/2000   Age-50    Family History  Problem Relation Age of  Onset   Diabetes Mellitus II Mother    Hypertension Mother    Bone cancer Father        Remission   Prostate cancer Father        Remission   Hypertension Sister    Cancer Paternal Grandmother    Cancer Paternal Grandfather        Maybe Lung    Social History   Tobacco Use   Smoking status: Never   Smokeless tobacco: Never  Substance Use Topics   Alcohol use: Yes    Alcohol/week: 1.0 standard drink    Types: 1 Glasses of wine per week    Comment: occasionally     Current Outpatient Medications:    amLODipine (NORVASC) 5 MG tablet, Take 1 tablet (5 mg total) by mouth daily., Disp: 90 tablet, Rfl: 1   atorvastatin (LIPITOR) 10 MG tablet, Take 10 mg by mouth daily., Disp: , Rfl:    Sod Picosulfate-Mag Ox-Cit Acd (CLENPIQ) 10-3.5-12 MG-GM -GM/160ML SOLN, Take 320 mLs by mouth as directed., Disp: 320 mL, Rfl: 0   valsartan (DIOVAN) 80 MG tablet, Take 1 tablet (80 mg total) by mouth daily., Disp: 30 tablet, Rfl: 1  No Known Allergies  I personally reviewed active problem list, medication list, allergies, family history, social history, health  maintenance with the patient/caregiver today.   ROS  Constitutional: Negative for fever or weight change.  Respiratory: Negative for cough and shortness of breath.   Cardiovascular: Negative for chest pain or palpitations.  Gastrointestinal: Negative for abdominal pain, no bowel changes.  Musculoskeletal: Negative for gait problem or joint swelling.  Skin: Negative for rash.  Neurological: Negative for dizziness or headache.  No other specific complaints in a complete review of systems (except as listed in HPI above).   Objective  Vitals:   11/18/20 1452  BP: 120/62  Pulse: 93  Resp: 16  Temp: 98.1 F (36.7 C)  TempSrc: Oral  SpO2: 98%  Weight: 233 lb 14.4 oz (106.1 kg)  Height: 5\' 8"  (1.727 m)    Body mass index is 35.56 kg/m.  Physical Exam  Constitutional: Patient appears well-developed and well-nourished. Obese   No distress.  HEENT: head atraumatic, normocephalic, pupils equal and reactive to light,neck supple Cardiovascular: Normal rate, regular rhythm and normal heart sounds.  No murmur heard. No BLE edema. Pulmonary/Chest: Effort normal and breath sounds normal. No respiratory distress. Abdominal: Soft.  There is no tenderness. Psychiatric: Patient has a normal mood and affect. behavior is normal. Judgment and thought content normal.    PHQ2/9: Depression screen [facility] 2/9 11/18/2020 08/12/2020 07/07/2020 03/18/2019 02/11/2019  Decreased Interest 0 1 1 0 1  Down, Depressed, Hopeless 0 0 0 0 0  PHQ - 2 Score 0 1 1 0 1  Altered sleeping 0 0 0 1 1  Tired, decreased energy 0 2 0 0 0  Change in appetite 0 0 0 0 0  Feeling bad or failure about yourself  0 0 0 0 0  Trouble concentrating 0 0 0 0 0  Moving slowly or fidgety/restless 0 0 0 0 0  Suicidal thoughts 0 0 0 0 0  PHQ-9 Score 0 3 1 1 2   Difficult doing work/chores Not difficult at all - Not difficult at all Not difficult at all Not difficult at all    phq 9 is negative   Fall Risk: Fall Risk  11/18/2020 08/12/2020 07/07/2020 03/18/2019 02/11/2019  Falls in the past year? 0 0 0 0 0  Number falls in past yr: 0 0 0 0 0  Injury with Fall? 0 0 0 0 0  Risk for fall due to : No Fall Risks - - - -  Follow up Falls prevention discussed - - - -      Functional Status Survey: Is the patient deaf or have difficulty hearing?: No Does the patient have difficulty seeing, even when wearing glasses/contacts?: No Does the patient have difficulty concentrating, remembering, or making decisions?: Yes Does the patient have difficulty walking or climbing stairs?: No Does the patient have difficulty dressing or bathing?: No Does the patient have difficulty doing errands alone such as visiting a doctor's office or shopping?: No    Assessment & Plan  1. Benign essential hypertension  - amLODipine (NORVASC) 5 MG tablet; Take 1 tablet (5 mg total) by mouth daily.   Dispense: 90 tablet; Refill: 1 - valsartan (DIOVAN) 80 MG tablet; Take 1 tablet (80 mg total) by mouth daily.  Dispense: 90 tablet; Refill: 1  2. Pre-diabetes   3. Mixed hyperlipidemia   4. Need for influenza vaccination  - Flu Vaccine QUAD 6+ mos PF IM (Fluarix Quad PF)  5. Morbid obesity (HCC)  - Liraglutide -Weight Management (SAXENDA) 18 MG/3ML SOPN; Inject 0.6-3 mg into the skin daily.  Dispense: 9 mL;  Refill: 0 - Insulin Pen Needle 32G X 6 MM MISC; 1 each by Does not apply route daily at 12 noon.  Dispense: 100 each; Refill: 1  6. Dysthymia   7. Colon cancer screening  - Cologuard  8. Need for shingles vaccine  - Varicella-zoster vaccine IM

## 2020-11-18 ENCOUNTER — Other Ambulatory Visit: Payer: Self-pay

## 2020-11-18 ENCOUNTER — Encounter: Payer: Self-pay | Admitting: Family Medicine

## 2020-11-18 ENCOUNTER — Ambulatory Visit: Payer: BC Managed Care – PPO | Admitting: Family Medicine

## 2020-11-18 VITALS — BP 120/62 | HR 93 | Temp 98.1°F | Resp 16 | Ht 68.0 in | Wt 233.9 lb

## 2020-11-18 DIAGNOSIS — E782 Mixed hyperlipidemia: Secondary | ICD-10-CM | POA: Diagnosis not present

## 2020-11-18 DIAGNOSIS — Z23 Encounter for immunization: Secondary | ICD-10-CM | POA: Diagnosis not present

## 2020-11-18 DIAGNOSIS — I10 Essential (primary) hypertension: Secondary | ICD-10-CM | POA: Diagnosis not present

## 2020-11-18 DIAGNOSIS — F341 Dysthymic disorder: Secondary | ICD-10-CM

## 2020-11-18 DIAGNOSIS — R7303 Prediabetes: Secondary | ICD-10-CM

## 2020-11-18 DIAGNOSIS — Z1211 Encounter for screening for malignant neoplasm of colon: Secondary | ICD-10-CM

## 2020-11-18 MED ORDER — SAXENDA 18 MG/3ML ~~LOC~~ SOPN: 0.6000 mg | PEN_INJECTOR | Freq: Every day | SUBCUTANEOUS | 0 refills | Status: DC

## 2020-11-18 MED ORDER — INSULIN PEN NEEDLE 32G X 6 MM MISC: 1.0000 | Freq: Every day | 1 refills | Status: DC

## 2020-11-18 MED ORDER — AMLODIPINE BESYLATE 5 MG PO TABS: 5.0000 mg | ORAL_TABLET | Freq: Every day | ORAL | 1 refills | Status: DC

## 2020-11-18 MED ORDER — VALSARTAN 80 MG PO TABS: 80.0000 mg | ORAL_TABLET | Freq: Every day | ORAL | 1 refills | Status: DC

## 2020-11-20 ENCOUNTER — Other Ambulatory Visit: Payer: Self-pay | Admitting: Family Medicine

## 2020-11-20 MED ORDER — WEGOVY 0.25 MG/0.5ML ~~LOC~~ SOAJ: 0.2500 mg | SUBCUTANEOUS | 0 refills | Status: DC

## 2020-11-23 ENCOUNTER — Other Ambulatory Visit: Payer: Self-pay | Admitting: Family Medicine

## 2020-11-23 DIAGNOSIS — R7303 Prediabetes: Secondary | ICD-10-CM

## 2020-11-23 MED ORDER — OZEMPIC (0.25 OR 0.5 MG/DOSE) 2 MG/1.5ML ~~LOC~~ SOPN: 0.5000 mg | PEN_INJECTOR | SUBCUTANEOUS | 0 refills | Status: DC

## 2020-12-30 ENCOUNTER — Other Ambulatory Visit: Payer: Self-pay

## 2020-12-30 ENCOUNTER — Encounter: Payer: Self-pay | Admitting: Family Medicine

## 2020-12-30 ENCOUNTER — Ambulatory Visit (INDEPENDENT_AMBULATORY_CARE_PROVIDER_SITE_OTHER): Payer: BC Managed Care – PPO | Admitting: Family Medicine

## 2020-12-30 ENCOUNTER — Other Ambulatory Visit (HOSPITAL_COMMUNITY)
Admission: RE | Admit: 2020-12-30 | Discharge: 2020-12-30 | Disposition: A | Discharge status: Home / Self Care | Payer: BC Managed Care – PPO | Source: Ambulatory Visit | Attending: Family Medicine | Admitting: Family Medicine

## 2020-12-30 VITALS — BP 140/86 | HR 73 | Temp 98.3°F | Resp 16 | Ht 68.0 in | Wt 233.8 lb

## 2020-12-30 DIAGNOSIS — Z23 Encounter for immunization: Secondary | ICD-10-CM

## 2020-12-30 DIAGNOSIS — N6322 Unspecified lump in the left breast, upper inner quadrant: Secondary | ICD-10-CM

## 2020-12-30 DIAGNOSIS — Z0000 Encounter for general adult medical examination without abnormal findings: Secondary | ICD-10-CM | POA: Diagnosis not present

## 2020-12-30 DIAGNOSIS — Z113 Encounter for screening for infections with a predominantly sexual mode of transmission: Secondary | ICD-10-CM

## 2020-12-30 DIAGNOSIS — Z1211 Encounter for screening for malignant neoplasm of colon: Secondary | ICD-10-CM

## 2020-12-30 NOTE — Patient Instructions (Signed)

## 2020-12-30 NOTE — Progress Notes (Signed)
Name: Cristina Bridges   MRN: [id number]    DOB: 02-19-1970   Date:12/30/2020       Progress Note  Subjective  Chief Complaint  Annual Exam  HPI  Patient presents for annual CPE.  Diet: discussed balanced diet  Exercise: discussed 150 minutes per week    [place] Office Visit from 12/30/2020 in [facility]  AUDIT-C Score 1      Depression: Phq 9 is  negative Depression screen [facility] 2/9 12/30/2020 11/18/2020 08/12/2020 07/07/2020 03/18/2019  Decreased Interest 0 0 1 1 0  Down, Depressed, Hopeless 0 0 0 0 0  PHQ - 2 Score 0 0 1 1 0  Altered sleeping 0 0 0 0 1  Tired, decreased energy 0 0 2 0 0  Change in appetite 0 0 0 0 0  Feeling bad or failure about yourself  0 0 0 0 0  Trouble concentrating 0 0 0 0 0  Moving slowly or fidgety/restless 0 0 0 0 0  Suicidal thoughts 0 0 0 0 0  PHQ-9 Score 0 0 _0 Difficult doing work/chores Not difficult at all Not difficult at all - Not difficult at all Not difficult at all  Some recent data might be hidden   Hypertension: BP Readings from Last 3 Encounters:  12/30/20 (!) 142/90  11/18/20 120/62  08/12/20 140/86   Obesity: Wt Readings from Last 3 Encounters:  12/30/20 233 lb 12.8 oz (106.1 kg)  11/18/20 233 lb 14.4 oz (106.1 kg)  08/12/20 229 lb (103.9 kg)   BMI Readings from Last 3 Encounters:  12/30/20 35.55 kg/m  11/18/20 35.56 kg/m  08/12/20 34.82 kg/m     Vaccines:   Shingrix:2nd dose today  Pneumonia: educated and discussed with patient. Flu: is up to date COVID-38: we will get a copy from pharmacy   Hep C Screening: 03/28/18 STD testing and prevention (HIV/chl/gon/syphilis): 03/18/19 Intimate partner violence: negative Sexual History : not sexually for about one year, not interested in getting STI screen except for GC Menstrual History/LMP/Abnormal Bleeding: discussed post-menopausal bleeding Incontinence Symptoms: no problems   Breast cancer:  - Last Mammogram: Scheduled 01/14/21 - BRCA gene  screening: N/A  Osteoporosis prevention : Discussed high calcium and vitamin D supplementation, weight bearing exercises  Cervical cancer screening: 03/18/19  Skin cancer: Discussed monitoring for atypical lesions  Colorectal cancer: she will do cologuard  Lung cancer: Low Dose CT Chest recommended if Age 17-80 years, 20 pack-year currently smoking OR have quit w/in 15years. Patient does not qualify.   ECG: 03/18/19  Advanced Care Planning: A voluntary discussion about advance care planning including the explanation and discussion of advance directives.  Discussed health care proxy and Living will, and the patient was able to identify a health care proxy as son Georgina Snell   Lipids: Lab Results  Component Value Date   CHOL 190 07/07/2020   CHOL 178 03/18/2019   CHOL 174 03/28/2018   Lab Results  Component Value Date   HDL 52 07/07/2020   HDL 50 03/18/2019   HDL 54 03/28/2018   Lab Results  Component Value Date   LDLCALC 111 (H) 07/07/2020   LDLCALC 105 (H) 03/18/2019   LDLCALC 97 03/28/2018   Lab Results  Component Value Date   TRIG 157 (H) 07/07/2020   TRIG 132 03/18/2019   TRIG 125 03/28/2018   Lab Results  Component Value Date   CHOLHDL 3.7 07/07/2020   CHOLHDL 3.6 03/18/2019   CHOLHDL 3.2  03/28/2018   No results found for: LDLDIRECT  Glucose: Glucose, Bld  Date Value Ref Range Status  07/07/2020 93 65 - 99 mg/dL Final    Comment:    .            Fasting reference interval .   03/18/2019 82 65 - 139 mg/dL Final    Comment:    .        Non-fasting reference interval .   03/28/2018 84 65 - 99 mg/dL Final    Comment:    .            Fasting reference interval .     Patient Active Problem List   Diagnosis Date Noted   Microalbuminuria 04/02/2018   Postmenopausal 05/23/2017   Hot flashes, menopausal 03/03/2016   Acne vulgaris 09/24/2014   Cystic acne 09/24/2014   Benign essential hypertension 04/07/2014   Body mass index (BMI) of 38.0-38.9 in adult  04/07/2014   Mixed hyperlipidemia 04/07/2014    Past Surgical History:  Procedure Laterality Date   CESAREAN SECTION  05/29/2000   Age-17    Family History  Problem Relation Age of Onset   Diabetes Mellitus II Mother    Hypertension Mother    Bone cancer Father        Remission   Prostate cancer Father        Remission   Hypertension Sister    Cancer Paternal Grandmother    Cancer Paternal Grandfather        Maybe Lung    Social History   Socioeconomic History   Marital status: Single    Spouse name: Not on file   Number of children: 1   Years of education: Not on file   Highest education level: Associate degree: academic program  Occupational History   Occupation: payroll   Tobacco Use   Smoking status: Never   Smokeless tobacco: Never  Vaping Use   Vaping Use: Never used  Substance and Sexual Activity   Alcohol use: Yes    Alcohol/week: 1.0 standard drink    Types: 1 Glasses of wine per week    Comment: occasionally   Drug use: Never   Sexual activity: Yes    Partners: Male    Birth control/protection: Post-menopausal  Other Topics Concern   Not on file  Social History Narrative   She works at [employer] at [employer], 29 yo son is still at home.    Social Determinants of Health   Financial Resource Strain: Low Risk    Difficulty of Paying Living Expenses: Not hard at all  Food Insecurity: No Food Insecurity   Worried About Charity fundraiser in the Last Year: Never true   [place] in the Last Year: Never true  Transportation Needs: No Transportation Needs   Lack of Transportation (Medical): No   Lack of Transportation (Non-Medical): No  Physical Activity: Insufficiently Active   Days of Exercise per Week: 2 days   Minutes of Exercise per Session: 10 min  Stress: No Stress Concern Present   Feeling of Stress : Only a little  Social Connections: Moderately Isolated   Frequency of Communication with Friends and Family: Twice a week   Frequency of  Social Gatherings with Friends and Family: Once a week   Attends Religious Services: 1 to 4 times per year   Active Member of [employer] or Organizations: No   Attends Archivist Meetings: Never   Marital Status: Never married  Intimate Partner Violence: Not At Risk   Fear of Current or Ex-Partner: No   Emotionally Abused: No   Physically Abused: No   Sexually Abused: No     Current Outpatient Medications:    amLODipine (NORVASC) 5 MG tablet, Take 1 tablet (5 mg total) by mouth daily., Disp: 90 tablet, Rfl: 1   Semaglutide,0.25 or 0.5MG/DOS, (OZEMPIC, 0.25 OR 0.5 MG/DOSE,) 2 MG/1.5ML SOPN, Inject 0.5 mg into the skin once a week. Start at 0.25 for the first two weeks after that stay at 0.5 weekly, Disp: 4.5 mL, Rfl: 0   valsartan (DIOVAN) 80 MG tablet, Take 1 tablet (80 mg total) by mouth daily., Disp: 90 tablet, Rfl: 1   Sod Picosulfate-Mag Ox-Cit Acd (CLENPIQ) 10-3.5-12 MG-GM -GM/160ML SOLN, Take 320 mLs by mouth as directed. (Patient not taking: Reported on 12/30/2020), Disp: 320 mL, Rfl: 0  No Known Allergies   ROS  Constitutional: Negative for fever or weight change.  Respiratory: Negative for cough and shortness of breath.   Cardiovascular: Negative for chest pain or palpitations.  Gastrointestinal: Negative for abdominal pain, no bowel changes.  Musculoskeletal: Negative for gait problem or joint swelling.  Skin: Negative for rash.  Neurological: Negative for dizziness or headache.  No other specific complaints in a complete review of systems (except as listed in HPI above).   Objective  Vitals:   12/30/20 1502 12/30/20 1511  BP: (!) 150/90 (!) 142/90  Pulse: 73   Resp: 16   Temp: 98.3 F (36.8 C)   TempSrc: Oral   SpO2: 99%   Weight: 233 lb 12.8 oz (106.1 kg)   Height: _0  (1.727 m)     Body mass index is 35.55 kg/m.  Physical Exam  Constitutional: Patient appears well-developed and well-nourished. No distress.  HENT: Head: Normocephalic and  atraumatic. Ears: B TMs ok, no erythema or effusion; Nose: Not done  Mouth/Throat: not done  Eyes: Conjunctivae and EOM are normal. Pupils are equal, round, and reactive to light. No scleral icterus.  Neck: Normal range of motion. Neck supple. No JVD present. No thyromegaly present.  Cardiovascular: Normal rate, regular rhythm and normal heart sounds.  No murmur heard. No BLE edema. Pulmonary/Chest: Effort normal and breath sounds normal. No respiratory distress. Abdominal: Soft. Bowel sounds are normal, no distension. There is no tenderness. no masses Breast:ump on left inner upper quadrant , no nipple discharge or rashes FEMALE GENITALIA:  Not done  RECTAL: not done  Musculoskeletal: Normal range of motion, no joint effusions. No gross deformities Neurological: he is alert and oriented to person, place, and time. No cranial nerve deficit. Coordination, balance, strength, speech and gait are normal.  Skin: Skin is warm and dry. No rash noted. No erythema.  Psychiatric: Patient has a normal mood and affect. behavior is normal. Judgment and thought content normal.   Fall Risk: Fall Risk  12/30/2020 11/18/2020 08/12/2020 07/07/2020 03/18/2019  Falls in the past year? 0 0 0 0 0  Number falls in past yr: 0 0 0 0 0  Injury with Fall? 0 0 0 0 0  Risk for fall due to : No Fall Risks No Fall Risks - - -  Follow up Falls prevention discussed Falls prevention discussed - - -     Functional Status Survey: Is the patient deaf or have difficulty hearing?: No Does the patient have difficulty seeing, even when wearing glasses/contacts?: No Does the patient have difficulty concentrating, remembering, or making decisions?: Yes Does the patient have difficulty  walking or climbing stairs?: No Does the patient have difficulty dressing or bathing?: No Does the patient have difficulty doing errands alone such as visiting a doctor's office or shopping?: No   Assessment & Plan  1. Well adult exam   2. Need  for shingles vaccine  - Varicella-zoster vaccine IM  3. Need for pneumococcal vaccine  - Pneumococcal conjugate vaccine 20-valent (Prevnar 20)  4. Routine screening for STI (sexually transmitted infection)  - Cervicovaginal ancillary only   5. Breast lump on left side at 11 o'clock position  - MM Digital Diagnostic Bilat; Future - US BREAST LTD UNI LEFT INC AXILLA; Future   -USPSTF grade A and B recommendations reviewed with patient; age-appropriate recommendations, preventive care, screening tests, etc discussed and encouraged; healthy living encouraged; see AVS for patient education given to patient -Discussed importance of 150 minutes of physical activity weekly, eat two servings of fish weekly, eat one serving of tree nuts ( cashews, pistachios, pecans, almonds.Marland Kitchen) every other day, eat 6 servings of fruit/vegetables daily and drink plenty of water and avoid sweet beverages.

## 2021-01-01 LAB — CERVICOVAGINAL ANCILLARY ONLY
Chlamydia: NEGATIVE
Comment: NEGATIVE
Comment: NORMAL
Neisseria Gonorrhea: NEGATIVE

## 2021-01-06 ENCOUNTER — Telehealth: Payer: Self-pay

## 2021-01-06 NOTE — Telephone Encounter (Signed)
CALLED PATIENT NO ANSWER LEFT VOICEMAIL FOR A CALL BACK ? ?

## 2021-01-13 ENCOUNTER — Telehealth: Payer: Self-pay

## 2021-01-13 NOTE — Telephone Encounter (Signed)
CALLED PATIENT NO ANSWER LEFT VOICEMAIL FOR A CALL BACK ? ?

## 2021-01-13 NOTE — Telephone Encounter (Signed)
CALLED PATIENT NO ANSWER LEFT VOICEMAIL FOR A CALL BACK °Letter sent °

## 2021-01-14 ENCOUNTER — Ambulatory Visit
Admission: RE | Admit: 2021-01-14 | Discharge: 2021-01-14 | Disposition: A | Discharge status: Home / Self Care | Payer: BC Managed Care – PPO | Source: Ambulatory Visit | Attending: Family Medicine | Admitting: Family Medicine

## 2021-01-14 ENCOUNTER — Other Ambulatory Visit: Payer: Self-pay

## 2021-01-14 DIAGNOSIS — N6322 Unspecified lump in the left breast, upper inner quadrant: Secondary | ICD-10-CM | POA: Diagnosis present

## 2021-02-08 ENCOUNTER — Other Ambulatory Visit: Payer: Self-pay | Admitting: Family Medicine

## 2021-02-08 DIAGNOSIS — R7303 Prediabetes: Secondary | ICD-10-CM

## 2021-03-12 ENCOUNTER — Encounter: Payer: Self-pay | Admitting: Family Medicine

## 2021-04-06 NOTE — Progress Notes (Signed)
Name: Cristina Bridges   MRN: [id number]    DOB: Mar 22, 1970   Date:04/07/2021 ? ?     Progress Note ? ?Subjective ? ?Chief Complaint ? ?Follow Up ? ?HPI ? ?HTN BP is at goal, no chest pain , palpitation or sob Her alsosterone/renin ration was elevated and also urine micro in the past. She cannot afford CT. Unchanged  ? ?Dysthymia: she had post-partum depression but otherwise not problems with mood and never took medications for depression. She was under more stress less visit but feeling better now. Phq 9 is good  ? ?Obesity:  BMI is now below 35, she has lost weight with Ozempic and would like to try going up on Ozempic today. She has noticed some constipation since started on medication. Discussed importance of going back for colonoscopy but also can try otc miralax and increase in physical activity and also fiber intake  ?  ?Dyslipidemia: on life style modification only  ? ?The 10-year ASCVD risk score (Arnett DK, et al., 2019) is: 4.6% ?  Values used to calculate the score: ?    Age: 51 years ?    Sex: Female ?    Is Non-Hispanic African American: Yes ?    Diabetic: No ?    Tobacco smoker: No ?    Systolic Blood Pressure: [id number] mmHg ?    Is BP treated: Yes ?    HDL Cholesterol: 52 mg/dL ?    Total Cholesterol: 190 mg/dL  ? ?Pre-diabetes: A1C is at goal at 5.6 % she is cutting down on carbs.  Denies polyphagia, polydipsia or polyuria. Currently on Ozempic  ? ?Patient Active Problem List  ? Diagnosis Date Noted  ? Microalbuminuria 04/02/2018  ? Postmenopausal 05/23/2017  ? Hot flashes, menopausal 03/03/2016  ? Acne vulgaris 09/24/2014  ? Cystic acne 09/24/2014  ? Benign essential hypertension 04/07/2014  ? Body mass index (BMI) of 38.0-38.9 in adult 04/07/2014  ? Mixed hyperlipidemia 04/07/2014  ? ? ?Past Surgical History:  ?Procedure Laterality Date  ? CESAREAN SECTION  05/29/2000  ? Age-1  ? ? ?Family History  ?Problem Relation Age of Onset  ? Diabetes Mellitus II Mother   ? Hypertension Mother   ? Bone cancer Father    ?     Remission  ? Prostate cancer Father   ?     Remission  ? Hypertension Sister   ? Cancer Paternal Grandmother   ? Cancer Paternal Grandfather   ?     Maybe Lung  ? ? ?Social History  ? ?Tobacco Use  ? Smoking status: Never  ? Smokeless tobacco: Never  ?Substance Use Topics  ? Alcohol use: Yes  ?  Alcohol/week: 1.0 standard drink  ?  Types: 1 Glasses of wine per week  ?  Comment: occasionally  ? ? ? ?Current Outpatient Medications:  ?  amLODipine (NORVASC) 5 MG tablet, Take 1 tablet (5 mg total) by mouth daily., Disp: 90 tablet, Rfl: 1 ?  OZEMPIC, 0.25 OR 0.5 MG/DOSE, 2 MG/1.5ML SOPN, INJECT 0.5MG  INTO THE SKIN ONCE A WEEK. START 0.25 FOR THE FIRST TWO WEEKS AFTER THAT STAY AT 0.5 WEEKLY, Disp: 4.5 mL, Rfl: 0 ?  valsartan (DIOVAN) 80 MG tablet, Take 1 tablet (80 mg total) by mouth daily., Disp: 90 tablet, Rfl: 1 ?  Sod Picosulfate-Mag Ox-Cit Acd (CLENPIQ) 10-3.5-12 MG-GM -GM/160ML SOLN, Take 320 mLs by mouth as directed. (Patient not taking: Reported on 04/07/2021), Disp: 320 mL, Rfl: 0 ? ?No Known Allergies ? ?I  personally reviewed active problem list, medication list, allergies, family history, social history, health maintenance with the patient/caregiver today. ? ? ?ROS ? ?Constitutional: Negative for fever or weight change.  ?Respiratory: Negative for cough and shortness of breath.   ?Cardiovascular: Negative for chest pain or palpitations.  ?Gastrointestinal: Negative for abdominal pain, no bowel changes.  ?Musculoskeletal: Negative for gait problem or joint swelling.  ?Skin: Negative for rash.  ?Neurological: Negative for dizziness or headache.  ?No other specific complaints in a complete review of systems (except as listed in HPI above).  ? ?Objective ? ?Vitals:  ? 04/07/21 1454  ?BP: 132/80  ?Pulse: 95  ?Resp: 16  ?SpO2: 98%  ?Weight: 229 lb (103.9 kg)  ?Height: 5\' 8"  (1.727 m)  ? ? ?Body mass index is 34.82 kg/m?. ? ?Physical Exam ? ?Constitutional: Patient appears well-developed and well-nourished.  Obese  No distress.  ?HEENT: head atraumatic, normocephalic, pupils equal and reactive to light, neck supple ?Cardiovascular: Normal rate, regular rhythm and normal heart sounds.  No murmur heard. No BLE edema. ?Pulmonary/Chest: Effort normal and breath sounds normal. No respiratory distress. ?Abdominal: Soft.  There is no tenderness. ?Psychiatric: Patient has a normal mood and affect. behavior is normal. Judgment and thought content normal.  ? ?PHQ2/9: ? ?  04/07/2021  ?  2:54 PM 12/30/2020  ?  2:52 PM 11/18/2020  ?  2:41 PM 08/12/2020  ?  3:00 PM 07/07/2020  ?  3:14 PM  ?Depression screen PHQ 2/9  ?Decreased Interest 0 0 0 1 1  ?Down, Depressed, Hopeless 0 0 0 0 0  ?PHQ - 2 Score 0 0 0 1 1  ?Altered sleeping 0 0 0 0 0  ?Tired, decreased energy 1 0 0 2 0  ?Change in appetite 0 0 0 0 0  ?Feeling bad or failure about yourself  0 0 0 0 0  ?Trouble concentrating 0 0 0 0 0  ?Moving slowly or fidgety/restless 0 0 0 0 0  ?Suicidal thoughts 0 0 0 0 0  ?PHQ-9 Score 1 0 0 3 1  ?Difficult doing work/chores  Not difficult at all Not difficult at all  Not difficult at all  ?  ?phq 9 is negative ? ? ?Fall Risk: ? ?  04/07/2021  ?  2:54 PM 12/30/2020  ?  2:51 PM 11/18/2020  ?  2:41 PM 08/12/2020  ?  3:00 PM 07/07/2020  ?  3:14 PM  ?Fall Risk   ?Falls in the past year? 0 0 0 0 0  ?Number falls in past yr: 0 0 0 0 0  ?Injury with Fall? 0 0 0 0 0  ?Risk for fall due to : No Fall Risks No Fall Risks No Fall Risks    ?Follow up Falls prevention discussed Falls prevention discussed Falls prevention discussed    ? ? ? ? ?Functional Status Survey: ?Is the patient deaf or have difficulty hearing?: No ?Does the patient have difficulty seeing, even when wearing glasses/contacts?: No ?Does the patient have difficulty concentrating, remembering, or making decisions?: No ?Does the patient have difficulty walking or climbing stairs?: No ?Does the patient have difficulty dressing or bathing?: No ?Does the patient have difficulty doing errands alone such  as visiting a doctor's office or shopping?: No ? ? ? ?Assessment & Plan ? ?1. Pre-diabetes ? ?- Semaglutide, 1 MG/DOSE, 4 MG/3ML SOPN; Inject 1 mg as directed once a week.  Dispense: 9 mL; Refill: 0 ? ?2. Obesity (BMI 30.0-34.9) ? ?Discussed with the patient  the risk posed by an increased BMI. Discussed importance of portion control, calorie counting and at least 150 minutes of physical activity weekly. Avoid sweet beverages and drink more water. Eat at least 6 servings of fruit and vegetables daily   ? ?Discussed keto, carbohydrate restrictive diet and consider Weight Watchers ? ? ?3. Benign essential hypertension ? ?Continue medication ? ?4. Mixed hyperlipidemia ? ? ?5. Colon cancer screening ? ?She will contact GI to schedule it   ?

## 2021-04-07 ENCOUNTER — Ambulatory Visit: Payer: BC Managed Care – PPO | Admitting: Family Medicine

## 2021-04-07 ENCOUNTER — Encounter: Payer: Self-pay | Admitting: Family Medicine

## 2021-04-07 VITALS — BP 132/80 | HR 95 | Resp 16 | Ht 68.0 in | Wt 229.0 lb

## 2021-04-07 DIAGNOSIS — E669 Obesity, unspecified: Secondary | ICD-10-CM

## 2021-04-07 DIAGNOSIS — R7303 Prediabetes: Secondary | ICD-10-CM

## 2021-04-07 DIAGNOSIS — Z1211 Encounter for screening for malignant neoplasm of colon: Secondary | ICD-10-CM

## 2021-04-07 DIAGNOSIS — I10 Essential (primary) hypertension: Secondary | ICD-10-CM | POA: Diagnosis not present

## 2021-04-07 DIAGNOSIS — E782 Mixed hyperlipidemia: Secondary | ICD-10-CM

## 2021-04-07 MED ORDER — SEMAGLUTIDE (1 MG/DOSE) 4 MG/3ML ~~LOC~~ SOPN: 1.0000 mg | PEN_INJECTOR | SUBCUTANEOUS | 0 refills | Status: DC

## 2021-04-07 MED ORDER — VALSARTAN 80 MG PO TABS: 80.0000 mg | ORAL_TABLET | Freq: Every day | ORAL | 1 refills | Status: DC

## 2021-04-07 MED ORDER — AMLODIPINE BESYLATE 5 MG PO TABS: 5.0000 mg | ORAL_TABLET | Freq: Every day | ORAL | 1 refills | Status: DC

## 2021-04-07 NOTE — Patient Instructions (Signed)
Options for weight loss: Contrave, Qsymia or Saxenda  ?

## 2021-07-16 ENCOUNTER — Ambulatory Visit: Payer: BC Managed Care – PPO | Admitting: Family Medicine

## 2021-08-13 NOTE — Progress Notes (Signed)
Name: Cristina Bridges   MRN: [id number]    DOB: 04/01/1970   Date:08/16/2021       Progress Note  Subjective  Chief Complaint  Follow Up  HPI  HTN BP was at goal last visit but elevated today. She took her medications as prescribed. She denies  chest pain , palpitation or sob Her alsosterone/renin ration was elevated and also urine micro in the past. She cannot afford CT. She will return for labs, she states bp has been controlled lately   Obesity:  BMI is now below 35, she has lost weight with Ozempic started Fall 2022 her weight was 233 lbs and today is 226 lbs, she is  now on 1 mg dose and tolerating it well, she still works two jobs and eats fast food one to two meals daily. Discussed importance of meal prepping . Marland Kitchen She has noticed some constipation intermittently    Dyslipidemia: on life style modification only   The 10-year ASCVD risk score (Arnett DK, et al., 2019) is: 6%   Values used to calculate the score:     Age: 51 years     Sex: Female     Is Non-Hispanic African American: Yes     Diabetic: No     Tobacco smoker: No     Systolic Blood Pressure: 140 mmHg     Is BP treated: Yes     HDL Cholesterol: 52 mg/dL     Total Cholesterol: 190 mg/dL   Pre-diabetes: U6J is at goal at 5.6 % she is cutting down on carbs.  Denies polyphagia, polydipsia or polyuria. Currently on Ozempic . We will recheck labs   Patient Active Problem List   Diagnosis Date Noted   Microalbuminuria 04/02/2018   Postmenopausal 05/23/2017   Hot flashes, menopausal 03/03/2016   Acne vulgaris 09/24/2014   Cystic acne 09/24/2014   Benign essential hypertension 04/07/2014   Body mass index (BMI) of 38.0-38.9 in adult 04/07/2014   Mixed hyperlipidemia 04/07/2014    Past Surgical History:  Procedure Laterality Date   CESAREAN SECTION  05/29/2000   Age-51    Family History  Problem Relation Age of Onset   Diabetes Mellitus II Mother    Hypertension Mother    Bone cancer Father        Remission    Prostate cancer Father        Remission   Hypertension Sister    Cancer Paternal Grandmother    Cancer Paternal Grandfather        Maybe Lung    Social History   Tobacco Use   Smoking status: Never   Smokeless tobacco: Never  Substance Use Topics   Alcohol use: Yes    Alcohol/week: 1.0 standard drink of alcohol    Types: 1 Glasses of wine per week    Comment: occasionally     Current Outpatient Medications:    amLODipine (NORVASC) 5 MG tablet, Take 1 tablet (5 mg total) by mouth daily., Disp: 90 tablet, Rfl: 1   Semaglutide, 1 MG/DOSE, 4 MG/3ML SOPN, Inject 1 mg as directed once a week., Disp: 9 mL, Rfl: 1   valsartan (DIOVAN) 80 MG tablet, Take 1 tablet (80 mg total) by mouth daily., Disp: 90 tablet, Rfl: 1  No Known Allergies  I personally reviewed active problem list, medication list, allergies, family history, social history, health maintenance with the patient/caregiver today.   ROS  Constitutional: Negative for fever or weight change.  Respiratory: Negative for cough and shortness  of breath.   Cardiovascular: Negative for chest pain or palpitations.  Gastrointestinal: Negative for abdominal pain, no bowel changes.  Musculoskeletal: Negative for gait problem or joint swelling.  Skin: Negative for rash.  Neurological: Negative for dizziness or headache.  No other specific complaints in a complete review of systems (except as listed in HPI above).   Objective  Vitals:   08/16/21 1447 08/16/21 1520  BP: (!) 150/100 (!) 140/88  Pulse: 89   Resp: 16   Temp: 98.2 F (36.8 C)   TempSrc: Oral   SpO2: 98%   Weight: 226 lb (102.5 kg)   Height: 5\' 8"  (1.727 m)     Body mass index is 34.36 kg/m.  Physical Exam  Constitutional: Patient appears well-developed and well-nourished. Obese  No distress.  HEENT: head atraumatic, normocephalic, pupils equal and reactive to light, neck supple Cardiovascular: Normal rate, regular rhythm and normal heart sounds.  No  murmur heard. No BLE edema. Pulmonary/Chest: Effort normal and breath sounds normal. No respiratory distress. Abdominal: Soft.  There is no tenderness. Psychiatric: Patient has a normal mood and affect. behavior is normal. Judgment and thought content normal.   PHQ2/9:    08/16/2021    2:53 PM 04/07/2021    2:54 PM 12/30/2020    2:52 PM 11/18/2020    2:41 PM 08/12/2020    3:00 PM  Depression screen PHQ 2/9  Decreased Interest 0 0 0 0 1  Down, Depressed, Hopeless 0 0 0 0 0  PHQ - 2 Score 0 0 0 0 1  Altered sleeping  0 0 0 0  Tired, decreased energy  1 0 0 2  Change in appetite  0 0 0 0  Feeling bad or failure about yourself   0 0 0 0  Trouble concentrating  0 0 0 0  Moving slowly or fidgety/restless  0 0 0 0  Suicidal thoughts  0 0 0 0  PHQ-9 Score  1 0 0 3  Difficult doing work/chores   Not difficult at all Not difficult at all     phq 9 is negative   Fall Risk:    08/16/2021    2:53 PM 04/07/2021    2:54 PM 12/30/2020    2:51 PM 11/18/2020    2:41 PM 08/12/2020    3:00 PM  Fall Risk   Falls in the past year? 0 0 0 0 0  Number falls in past yr:  0 0 0 0  Injury with Fall?  0 0 0 0  Risk for fall due to : No Fall Risks No Fall Risks No Fall Risks No Fall Risks   Follow up Falls prevention discussed Falls prevention discussed Falls prevention discussed Falls prevention discussed       Assessment & Plan  1. Pre-diabetes  - Hemoglobin A1c - Semaglutide, 1 MG/DOSE, 4 MG/3ML SOPN; Inject 1 mg as directed once a week.  Dispense: 9 mL; Refill: 1  2. Obesity (BMI 30.0-34.9)  Discussed with the patient the risk posed by an increased BMI. Discussed importance of portion control, calorie counting and at least 150 minutes of physical activity weekly. Avoid sweet beverages and drink more water. Eat at least 6 servings of fruit and vegetables daily    3. Benign essential hypertension  - COMPLETE METABOLIC PANEL WITH GFR - CBC with Differential/Platelet - Urine Microalbumin  w/creat. ratio - amLODipine (NORVASC) 5 MG tablet; Take 1 tablet (5 mg total) by mouth daily.  Dispense: 90 tablet; Refill: 1 -  valsartan (DIOVAN) 80 MG tablet; Take 1 tablet (80 mg total) by mouth daily.  Dispense: 90 tablet; Refill: 1  4. Colon cancer screening  She has cologuard at home   5. Mixed hyperlipidemia  - Lipid panel  6. 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 Microalbuminuria  - Urine Microalbumin w/creat. ratio

## 2021-08-16 ENCOUNTER — Ambulatory Visit: Payer: BC Managed Care – PPO | Admitting: Family Medicine

## 2021-08-16 ENCOUNTER — Encounter: Payer: Self-pay | Admitting: Family Medicine

## 2021-08-16 VITALS — BP 140/88 | HR 89 | Temp 98.2°F | Resp 16 | Ht 68.0 in | Wt 226.0 lb

## 2021-08-16 DIAGNOSIS — R809 Proteinuria, unspecified: Secondary | ICD-10-CM

## 2021-08-16 DIAGNOSIS — E669 Obesity, unspecified: Secondary | ICD-10-CM

## 2021-08-16 DIAGNOSIS — I10 Essential (primary) hypertension: Secondary | ICD-10-CM

## 2021-08-16 DIAGNOSIS — E782 Mixed hyperlipidemia: Secondary | ICD-10-CM

## 2021-08-16 DIAGNOSIS — R7989 Other specified abnormal findings of blood chemistry: Secondary | ICD-10-CM

## 2021-08-16 DIAGNOSIS — R7303 Prediabetes: Secondary | ICD-10-CM

## 2021-08-16 DIAGNOSIS — Z1211 Encounter for screening for malignant neoplasm of colon: Secondary | ICD-10-CM | POA: Diagnosis not present

## 2021-08-16 MED ORDER — SEMAGLUTIDE (1 MG/DOSE) 4 MG/3ML ~~LOC~~ SOPN: 1.0000 mg | PEN_INJECTOR | SUBCUTANEOUS | 1 refills | Status: DC

## 2021-08-16 MED ORDER — AMLODIPINE BESYLATE 5 MG PO TABS: 5.0000 mg | ORAL_TABLET | Freq: Every day | ORAL | 1 refills | Status: DC

## 2021-08-16 MED ORDER — VALSARTAN 80 MG PO TABS: 80.0000 mg | ORAL_TABLET | Freq: Every day | ORAL | 1 refills | Status: DC

## 2021-08-24 LAB — COMPLETE METABOLIC PANEL WITH GFR
AG Ratio: 1.7 (calc) (ref 1.0–2.5)
ALT: 14 U/L (ref 6–29)
AST: 14 U/L (ref 10–35)
Albumin: 4.3 g/dL (ref 3.6–5.1)
Alkaline phosphatase (APISO): 83 U/L (ref 37–153)
BUN: 11 mg/dL (ref 7–25)
CO2: 30 mmol/L (ref 20–32)
Calcium: 9.6 mg/dL (ref 8.6–10.4)
Chloride: 106 mmol/L (ref 98–110)
Creat: 0.83 mg/dL (ref 0.50–1.03)
Globulin: 2.5 g/dL (calc) (ref 1.9–3.7)
Glucose, Bld: 87 mg/dL (ref 65–99)
Potassium: 4.4 mmol/L (ref 3.5–5.3)
Sodium: 142 mmol/L (ref 135–146)
Total Bilirubin: 0.5 mg/dL (ref 0.2–1.2)
Total Protein: 6.8 g/dL (ref 6.1–8.1)
eGFR: 84 mL/min/{1.73_m2} (ref >=60)

## 2021-08-24 LAB — CBC WITH DIFFERENTIAL/PLATELET
Absolute Monocytes: 356 cells/uL (ref 200–950)
Basophils Absolute: 9 cells/uL (ref 0–200)
Basophils Relative: 0.2 %
Eosinophils Absolute: 167 cells/uL (ref 15–500)
Eosinophils Relative: 3.8 %
HCT: 43.3 % (ref 35.0–45.0)
Hemoglobin: 14.5 g/dL (ref 11.7–15.5)
Lymphs Abs: 1584 cells/uL (ref 850–3900)
MCH: 30.7 pg (ref 27.0–33.0)
MCHC: 33.5 g/dL (ref 32.0–36.0)
MCV: 91.7 fL (ref 80.0–100.0)
MPV: 10.7 fL (ref 7.5–12.5)
Monocytes Relative: 8.1 %
Neutro Abs: 2284 cells/uL (ref 1500–7800)
Neutrophils Relative %: 51.9 %
Platelets: 313 10*3/uL (ref 140–400)
RBC: 4.72 10*6/uL (ref 3.80–5.10)
RDW: 12.8 % (ref 11.0–15.0)
Total Lymphocyte: 36 %
WBC: 4.4 10*3/uL (ref 3.8–10.8)

## 2021-08-24 LAB — MICROALBUMIN / CREATININE URINE RATIO
Creatinine, Urine: 298 mg/dL — ABNORMAL HIGH (ref 20–275)
Microalb Creat Ratio: 21 mcg/mg creat (ref <30)
Microalb, Ur: 6.3 mg/dL

## 2021-08-24 LAB — LIPID PANEL
Cholesterol: 175 mg/dL (ref <200)
HDL: 54 mg/dL (ref >=50)
LDL Cholesterol (Calc): 102 mg/dL (calc) — ABNORMAL HIGH
Non-HDL Cholesterol (Calc): 121 mg/dL (calc) (ref <130)
Total CHOL/HDL Ratio: 3.2 (calc) (ref <5.0)
Triglycerides: 99 mg/dL (ref <150)

## 2021-08-24 LAB — HEMOGLOBIN A1C
Hgb A1c MFr Bld: 5.3 % of total Hgb (ref <5.7)
Mean Plasma Glucose: 105 mg/dL
eAG (mmol/L): 5.8 mmol/L

## 2021-08-24 LAB — ALDOSTERONE + RENIN ACTIVITY W/ RATIO
ALDO / PRA Ratio: 50 Ratio — ABNORMAL HIGH (ref 0.9–28.9)
Aldosterone: 9 ng/dL
Renin Activity: 0.18 ng/mL/h — ABNORMAL LOW (ref 0.25–5.82)

## 2021-11-16 NOTE — Progress Notes (Unsigned)
Name: Cristina Bridges   MRN: [id number]    DOB: 12-May-1970   Date:11/17/2021       Progress Note  Subjective  Chief Complaint  Follow Up  HPI  HTN BP is at goal today She denies  chest pain , palpitation or sob Her alsosterone/renin and urine micro still high but improved  She cannot afford CT. Continue current regiment   Obesity:  BMI is now below 35, she has lost weight with Ozempic started Fall 2022 her weight was 233 lbs and  now is stable at 226 lbs , she has been taking Ozempic   1 mg dose and tolerating it well, she still works two jobs and eats fast food about twice a week.  She has noticed some constipation since started on medication , can go up to three days without a bowel movement but usually has a bowel movement every other day    Dyslipidemia: on life style modification only   The 10-year ASCVD risk score (Arnett DK, et al., 2019) is: 3.9%   Values used to calculate the score:     Age: 20 years     Sex: Female     Is Non-Hispanic African American: Yes     Diabetic: No     Tobacco smoker: No     Systolic Blood Pressure: 106 mmHg     Is BP treated: Yes     HDL Cholesterol: 54 mg/dL     Total Cholesterol: 175 mg/dL   Pre-diabetes: A1C used to be 5.7 % but is down to 5.3 %  Denies polyphagia, polydipsia or polyuria. Currently on Ozempic .  Patient Active Problem List   Diagnosis Date Noted   Microalbuminuria 04/02/2018   Postmenopausal 05/23/2017   Hot flashes, menopausal 03/03/2016   Acne vulgaris 09/24/2014   Cystic acne 09/24/2014   Benign essential hypertension 04/07/2014   Body mass index (BMI) of 38.0-38.9 in adult 04/07/2014   Mixed hyperlipidemia 04/07/2014    Past Surgical History:  Procedure Laterality Date   CESAREAN SECTION  05/29/2000   Age-5    Family History  Problem Relation Age of Onset   Diabetes Mellitus II Mother    Hypertension Mother    Bone cancer Father        Remission   Prostate cancer Father        Remission   Hypertension  Sister    Cancer Paternal Grandmother    Cancer Paternal Grandfather        Maybe Lung    Social History   Tobacco Use   Smoking status: Never   Smokeless tobacco: Never  Substance Use Topics   Alcohol use: Yes    Alcohol/week: 1.0 standard drink of alcohol    Types: 1 Glasses of wine per week    Comment: occasionally     Current Outpatient Medications:    amLODipine (NORVASC) 5 MG tablet, Take 1 tablet (5 mg total) by mouth daily., Disp: 90 tablet, Rfl: 1   Semaglutide, 1 MG/DOSE, 4 MG/3ML SOPN, Inject 1 mg as directed once a week., Disp: 9 mL, Rfl: 1   valsartan (DIOVAN) 80 MG tablet, Take 1 tablet (80 mg total) by mouth daily., Disp: 90 tablet, Rfl: 1  No Known Allergies  I personally reviewed active problem list, medication list, allergies, family history, social history, health maintenance with the patient/caregiver today.   ROS  Constitutional: Negative for fever or weight change.  Respiratory: Negative for cough and shortness of breath.   Cardiovascular:  Negative for chest pain or palpitations.  Gastrointestinal: Negative for abdominal pain, no bowel changes.  Musculoskeletal: Negative for gait problem or joint swelling.  Skin: Negative for rash.  Neurological: Negative for dizziness or headache.  No other specific complaints in a complete review of systems (except as listed in HPI above).   Objective  Vitals:   11/17/21 1357  BP: 128/76  Pulse: 85  Resp: 16  SpO2: 98%  Weight: 226 lb (102.5 kg)  Height: 5\' 8"  (1.727 m)    Body mass index is 34.36 kg/m.  Physical Exam  Constitutional: Patient appears well-developed and well-nourished. Obese  No distress.  HEENT: head atraumatic, normocephalic, pupils equal and reactive to light, neck supple, throat within normal limits Cardiovascular: Normal rate, regular rhythm and normal heart sounds.  No murmur heard. No BLE edema. Pulmonary/Chest: Effort normal and breath sounds normal. No respiratory  distress. Abdominal: Soft.  There is no tenderness. Psychiatric: Patient has a normal mood and affect. behavior is normal. Judgment and thought content normal.     PHQ2/9:    11/17/2021    1:57 PM 08/16/2021    2:53 PM 04/07/2021    2:54 PM 12/30/2020    2:52 PM 11/18/2020    2:41 PM  Depression screen PHQ 2/9  Decreased Interest 0 0 0 0 0  Down, Depressed, Hopeless 0 0 0 0 0  PHQ - 2 Score 0 0 0 0 0  Altered sleeping 0  0 0 0  Tired, decreased energy 0  1 0 0  Change in appetite 0  0 0 0  Feeling bad or failure about yourself  0  0 0 0  Trouble concentrating 0  0 0 0  Moving slowly or fidgety/restless 0  0 0 0  Suicidal thoughts 0  0 0 0  PHQ-9 Score 0  1 0 0  Difficult doing work/chores    Not difficult at all Not difficult at all    phq 9 is negative   Fall Risk:    11/17/2021    1:56 PM 08/16/2021    2:53 PM 04/07/2021    2:54 PM 12/30/2020    2:51 PM 11/18/2020    2:41 PM  Fall Risk   Falls in the past year? 0 0 0 0 0  Number falls in past yr: 0  0 0 0  Injury with Fall? 0  0 0 0  Risk for fall due to : No Fall Risks No Fall Risks No Fall Risks No Fall Risks No Fall Risks  Follow up Falls prevention discussed Falls prevention discussed Falls prevention discussed Falls prevention discussed Falls prevention discussed      Functional Status Survey: Is the patient deaf or have difficulty hearing?: No Does the patient have difficulty seeing, even when wearing glasses/contacts?: No Does the patient have difficulty concentrating, remembering, or making decisions?: No Does the patient have difficulty walking or climbing stairs?: No Does the patient have difficulty dressing or bathing?: No Does the patient have difficulty doing errands alone such as visiting a doctor's office or shopping?: No    Assessment & Plan  1. Pre-diabetes  Doing well on medication   2. Obesity (BMI 30.0-34.9)  Discussed with the patient the risk posed by an increased BMI. Discussed  importance of portion control, calorie counting and at least 150 minutes of physical activity weekly. Avoid sweet beverages and drink more water. Eat at least 6 servings of fruit and vegetables daily    3. Benign essential  hypertension  At goal today   4. Mixed hyperlipidemia  Reviewed labs with patient   5. 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  Improving  6. Colon cancer screening  - Cologuard  7. Microalbuminuria

## 2021-11-17 ENCOUNTER — Encounter: Payer: Self-pay | Admitting: Family Medicine

## 2021-11-17 ENCOUNTER — Ambulatory Visit: Payer: BC Managed Care – PPO | Admitting: Family Medicine

## 2021-11-17 VITALS — BP 128/76 | HR 85 | Resp 16 | Ht 68.0 in | Wt 226.0 lb

## 2021-11-17 DIAGNOSIS — R809 Proteinuria, unspecified: Secondary | ICD-10-CM

## 2021-11-17 DIAGNOSIS — Z1211 Encounter for screening for malignant neoplasm of colon: Secondary | ICD-10-CM

## 2021-11-17 DIAGNOSIS — E782 Mixed hyperlipidemia: Secondary | ICD-10-CM | POA: Diagnosis not present

## 2021-11-17 DIAGNOSIS — R7989 Other specified abnormal findings of blood chemistry: Secondary | ICD-10-CM

## 2021-11-17 DIAGNOSIS — E669 Obesity, unspecified: Secondary | ICD-10-CM

## 2021-11-17 DIAGNOSIS — R7303 Prediabetes: Secondary | ICD-10-CM

## 2021-11-17 DIAGNOSIS — I10 Essential (primary) hypertension: Secondary | ICD-10-CM

## 2021-11-17 NOTE — Patient Instructions (Signed)
If Ozempic not covered contact insurance to find out what medications they pay for weight loss Contrave Qsymia  Best choices for you would be :  [facility]  [place]

## 2021-11-19 ENCOUNTER — Other Ambulatory Visit: Payer: Self-pay | Admitting: Family Medicine

## 2021-11-19 DIAGNOSIS — R7303 Prediabetes: Secondary | ICD-10-CM

## 2021-11-24 ENCOUNTER — Encounter: Payer: Self-pay | Admitting: Family Medicine

## 2021-11-24 ENCOUNTER — Other Ambulatory Visit: Payer: Self-pay | Admitting: Family Medicine

## 2021-11-24 DIAGNOSIS — R7303 Prediabetes: Secondary | ICD-10-CM

## 2021-11-24 MED ORDER — OZEMPIC (1 MG/DOSE) 4 MG/3ML ~~LOC~~ SOPN: 1.0000 mg | PEN_INJECTOR | SUBCUTANEOUS | 0 refills | Status: DC

## 2021-12-30 NOTE — Progress Notes (Signed)
Name: Cristina Bridges   MRN: [id number]    DOB: [birth year]   Date:12/31/2021       Progress Note  Subjective  Chief Complaint  Annual Exam  HPI  Patient presents for annual CPE.  Diet: cooking more, packs lunch for work  Exercise:  discussed 150 minutes per week  Last Eye Exam: up to date  Last Dental Exam: up to date   [employer] Visit from 12/31/[facility] in [facility]  AUDIT-C Score 0      Depression: Phq 9 is  negative    12/31/2021    8:15 AM 11/17/2021    1:57 PM 08/16/2021    2:53 PM 04/07/2021    2:54 PM 12/30/2020    2:52 PM  Depression screen PHQ 2/9  Decreased Interest 0 0 0 0 0  Down, Depressed, Hopeless 0 0 0 0 0  PHQ - 2 Score 0 0 0 0 0  Altered sleeping 0 0  0 0  Tired, decreased energy 0 0  1 0  Change in appetite 0 0  0 0  Feeling bad or failure about yourself  0 0  0 0  Trouble concentrating 0 0  0 0  Moving slowly or fidgety/restless 0 0  0 0  Suicidal thoughts 0 0  0 0  PHQ-9 Score 0 0  1 0  Difficult doing work/chores Not difficult at all    Not difficult at all   Hypertension: BP Readings from Last 3 Encounters:  12/31/21 136/84  11/17/21 128/76  08/16/21 (!) 140/88   Obesity: Wt Readings from Last 3 Encounters:  12/31/21 224 lb 6.4 oz (101.8 kg)  11/17/21 226 lb (102.5 kg)  08/16/21 226 lb (102.5 kg)   BMI Readings from Last 3 Encounters:  12/31/21 33.87 kg/m  11/17/21 34.36 kg/m  08/16/21 34.36 kg/m     Vaccines:    Tdap: up to date Shingrix: up to date Pneumonia: N/A Flu: today  COVID-19: discussed booster    Hep C Screening: 03/28/18 STD testing and prevention (HIV/chl/gon/syphilis): 03/18/19 Intimate partner violence: negative screen  Sexual History : not currently but had a new sexual partner since last pap smear  Menstrual History/LMP/Abnormal Bleeding: post-menopausal  Discussed importance of follow up if any post-menopausal bleeding: yes  Incontinence Symptoms: negative for symptoms    Breast cancer:  - Last Mammogram: 01/14/21 - BRCA gene screening: N/A  Osteoporosis Prevention : Discussed high calcium and vitamin D supplementation, weight bearing exercises Bone density: N/A   Cervical cancer screening: 03/18/19, had a new sexual partner and we will recheck it today   Skin cancer: Discussed monitoring for atypical lesions  Colorectal cancer: N/A- Ordered   Lung cancer:  Low Dose CT Chest recommended if Age 47-80 years, 20 pack-year currently smoking OR have quit w/in 15years. Patient does not qualify for screen   ECG: 03/18/19  Advanced Care Planning: A voluntary discussion about advance care planning including the explanation and discussion of advance directives.  Discussed health care proxy and Living will, and the patient was able to identify a health care proxy as son .  Patient does not have a living will and power of attorney of health care   Lipids: Lab Results  Component Value Date   CHOL 175 08/20/2021   CHOL 190 07/07/2020   CHOL 178 03/18/2019   Lab Results  Component Value Date   HDL 54 08/20/2021   HDL 52 07/07/2020   HDL 50 03/18/2019  Lab Results  Component Value Date   LDLCALC 102 (H) 08/20/2021   LDLCALC 111 (H) 07/07/2020   LDLCALC 105 (H) 03/18/2019   Lab Results  Component Value Date   TRIG 99 08/20/2021   TRIG 157 (H) 07/07/2020   TRIG 132 03/18/2019   Lab Results  Component Value Date   CHOLHDL 3.2 08/20/2021   CHOLHDL 3.7 07/07/2020   CHOLHDL 3.6 03/18/2019   No results found for: "LDLDIRECT"  Glucose: Glucose, Bld  Date Value Ref Range Status  08/20/2021 87 65 - 99 mg/dL Final    Comment:    .            Fasting reference interval .   07/07/2020 93 65 - 99 mg/dL Final    Comment:    .            Fasting reference interval .   03/18/2019 82 65 - 139 mg/dL Final    Comment:    .        Non-fasting reference interval .     Patient Active Problem List   Diagnosis Date Noted   Microalbuminuria 04/02/2018    Postmenopausal 05/23/2017   Hot flashes, menopausal 03/03/2016   Acne vulgaris 09/24/2014   Cystic acne 09/24/2014   Benign essential hypertension 04/07/2014   Body mass index (BMI) of 38.0-38.9 in adult 04/07/2014   Mixed hyperlipidemia 04/07/2014    Past Surgical History:  Procedure Laterality Date   CESAREAN SECTION  05/29/2000   Age-14    Family History  Problem Relation Age of Onset   Diabetes Mellitus II Mother    Hypertension Mother    Bone cancer Father        Remission   Prostate cancer Father        Remission   Hypertension Sister    Cancer Paternal Grandmother    Cancer Paternal Grandfather        Maybe Lung    Social History   Socioeconomic History   Marital status: Single    Spouse name: Not on file   Number of children: 1   Years of education: Not on file   Highest education level: Associate degree: academic program  Occupational History   Occupation: payroll   Tobacco Use   Smoking status: Never   Smokeless tobacco: Never  Vaping Use   Vaping Use: Never used  Substance and Sexual Activity   Alcohol use: Not Currently    Alcohol/week: 1.0 standard drink of alcohol    Types: 1 Glasses of wine per week    Comment: occasionally   Drug use: Never   Sexual activity: Not Currently    Partners: Male    Birth control/protection: Post-menopausal  Other Topics Concern   Not on file  Social History Narrative   She works at [employer] at [employer]   Adult son    Social Determinants of Health   Financial Resource Strain: [place]  (12/31/2021)   Overall Financial Resource Strain (CARDIA)    Difficulty of Paying Living Expenses: Not hard at all  Food Insecurity: No Food Insecurity (12/31/2021)   Hunger Vital Sign    Worried About Running Out of Food in the Last Year: Never true    [place] in the Last Year: Never true  Transportation Needs: No Transportation Needs (12/31/2021)   PRAPARE - Hydrologist (Medical): No     Lack of Transportation (Non-Medical): No  Physical Activity: Insufficiently Active (12/31/2021)  Exercise Vital Sign    Days of Exercise per Week: 1 day    Minutes of Exercise per Session: 10 min  Stress: No Stress Concern Present (12/31/2021)   [place]    Feeling of Stress : Only a little  Social Connections: Moderately Isolated (12/31/2021)   Social Connection and Isolation Panel [NHANES]    Frequency of Communication with Friends and Family: Twice a week    Frequency of Social Gatherings with Friends and Family: Once a week    Attends Religious Services: 1 to 4 times per year    Active Member of [employer] or Organizations: No    Attends Archivist Meetings: Never    Marital Status: Never married  Intimate Partner Violence: Not At Risk (12/31/2021)   Humiliation, Afraid, Rape, and Kick questionnaire    Fear of Current or Ex-Partner: No    Emotionally Abused: No    Physically Abused: No    Sexually Abused: No     Current Outpatient Medications:    amLODipine (NORVASC) 5 MG tablet, Take 1 tablet (5 mg total) by mouth daily., Disp: 90 tablet, Rfl: 1   Semaglutide, 1 MG/DOSE, (OZEMPIC, 1 MG/DOSE,) 4 MG/3ML SOPN, Inject 1 mg into the skin once a week., Disp: 9 mL, Rfl: 0   valsartan (DIOVAN) 80 MG tablet, Take 1 tablet (80 mg total) by mouth daily., Disp: 90 tablet, Rfl: 1  No Known Allergies   ROS  Constitutional: Negative for fever or weight change.  Respiratory: Negative for cough and shortness of breath.   Cardiovascular: Negative for chest pain or palpitations.  Gastrointestinal: Negative for abdominal pain, no bowel changes.  Musculoskeletal: Negative for gait problem or joint swelling.  Skin: Negative for rash.  Neurological: Negative for dizziness or headache.  No other specific complaints in a complete review of systems (except as listed in HPI above).   Objective  Vitals:   12/31/21 0823   BP: 136/84  Pulse: 80  Resp: 16  Temp: 98.2 F (36.8 C)  TempSrc: Oral  SpO2: 99%  Weight: 224 lb 6.4 oz (101.8 kg)  Height: 5' 8.25" (1.734 m)    Body mass index is 33.87 kg/m.  Physical Exam  Constitutional: Patient appears well-developed and well-nourished. No distress.  HENT: Head: Normocephalic and atraumatic. Ears: B TMs ok, no erythema or effusion; Nose: Nose normal. Mouth/Throat: Oropharynx is clear and moist. No oropharyngeal exudate.  Eyes: Conjunctivae and EOM are normal. Pupils are equal, round, and reactive to light. No scleral icterus.  Neck: Normal range of motion. Neck supple. No JVD present. No thyromegaly present.  Cardiovascular: Normal rate, regular rhythm and normal heart sounds.  No murmur heard. No BLE edema. Pulmonary/Chest: Effort normal and breath sounds normal. No respiratory distress. Abdominal: Soft. Bowel sounds are normal, no distension. There is no tenderness. no masses Breast: lumpy breasts, she still has the mass at 11 o'clock, last year [place] negative and also normal mammogram ,  no nipple discharge or rashes FEMALE GENITALIA:  External genitalia normal External urethra normal Vaginal vault normal without discharge or lesions Cervix normal without discharge or lesions Bimanual exam normal without masses RECTAL: no rectal masses or hemorrhoids Musculoskeletal: Normal range of motion, no joint effusions. No gross deformities Neurological: he is alert and oriented to person, place, and time. No cranial nerve deficit. Coordination, balance, strength, speech and gait are normal.  Skin: Skin is warm and dry. No rash noted. No erythema.  Psychiatric:  Patient has a normal mood and affect. behavior is normal. Judgment and thought content normal.    Fall Risk:    12/31/2021    8:15 AM 11/17/2021    1:56 PM 08/16/2021    2:53 PM 04/07/2021    2:54 PM 12/30/2020    2:51 PM  Fall Risk   Falls in the past year? 0 0 0 0 0  Number falls in past yr: 0 0  0  0  Injury with Fall? 0 0  0 0  Risk for fall due to : _0   Follow up Falls prevention discussed;Education provided;Falls evaluation completed Falls prevention discussed Falls prevention discussed Falls prevention discussed Falls prevention discussed     Functional Status Survey: Is the patient deaf or have difficulty hearing?: No Does the patient have difficulty seeing, even when wearing glasses/contacts?: No Does the patient have difficulty concentrating, remembering, or making decisions?: No Does the patient have difficulty walking or climbing stairs?: No Does the patient have difficulty dressing or bathing?: No Does the patient have difficulty doing errands alone such as visiting a doctor's office or shopping?: No   Assessment & Plan  1. Well adult exam   2. Colon cancer screening  She has cologuard box at home   3. Need for influenza vaccination  - Flu Vaccine QUAD 6+ mos PF IM (Fluarix Quad PF)  4. Breast cancer screening by mammogram  - MM 3D SCREEN BREAST BILATERAL; Future  5. Cervical cancer screening  - Cytology - PAP    -USPSTF grade A and B recommendations reviewed with patient; age-appropriate recommendations, preventive care, screening tests, etc discussed and encouraged; healthy living encouraged; see AVS for patient education given to patient -Discussed importance of 150 minutes of physical activity weekly, eat two servings of fish weekly, eat one serving of tree nuts ( cashews, pistachios, pecans, almonds.Marland Kitchen) every other day, eat 6 servings of fruit/vegetables daily and drink plenty of water and avoid sweet beverages.   -Reviewed Health Maintenance: Yes.

## 2021-12-30 NOTE — Patient Instructions (Addendum)
Check with insurance if they cover Wegovy or Zepbound   Preventive Care 10-54 Years Old, Female Preventive care refers to lifestyle choices and visits with your health care provider that can promote health and wellness. Preventive care visits are also called wellness exams. What can I expect for my preventive care visit? Counseling Your health care provider may ask you questions about your: Medical history, including: Past medical problems. Family medical history. Pregnancy history. Current health, including: Menstrual cycle. Method of birth control. Emotional well-being. Home life and relationship well-being. Sexual activity and sexual health. Lifestyle, including: Alcohol, nicotine or tobacco, and drug use. Access to firearms. Diet, exercise, and sleep habits. Work and work Statistician. Sunscreen use. Safety issues such as seatbelt and bike helmet use. Physical exam Your health care provider will check your: Height and weight. These may be used to calculate your BMI (body mass index). BMI is a measurement that tells if you are at a healthy weight. Waist circumference. This measures the distance around your waistline. This measurement also tells if you are at a healthy weight and may help predict your risk of certain diseases, such as type 2 diabetes and high blood pressure. Heart rate and blood pressure. Body temperature. Skin for abnormal spots. What immunizations do I need?  Vaccines are usually given at various ages, according to a schedule. Your health care provider will recommend vaccines for you based on your age, medical history, and lifestyle or other factors, such as travel or where you work. What tests do I need? Screening Your health care provider may recommend screening tests for certain conditions. This may include: Lipid and cholesterol levels. Diabetes screening. This is done by checking your blood sugar (glucose) after you have not eaten for a while  (fasting). Pelvic exam and Pap test. Hepatitis B test. Hepatitis C test. HIV (human immunodeficiency virus) test. STI (sexually transmitted infection) testing, if you are at risk. Lung cancer screening. Colorectal cancer screening. Mammogram. Talk with your health care provider about when you should start having regular mammograms. This may depend on whether you have a family history of breast cancer. BRCA-related cancer screening. This may be done if you have a family history of breast, ovarian, tubal, or peritoneal cancers. Bone density scan. This is done to screen for osteoporosis. Talk with your health care provider about your test results, treatment options, and if necessary, the need for more tests. Follow these instructions at home: Eating and drinking  Eat a diet that includes fresh fruits and vegetables, whole grains, lean protein, and low-fat dairy products. Take vitamin and mineral supplements as recommended by your health care provider. Do not drink alcohol if: Your health care provider tells you not to drink. You are pregnant, may be pregnant, or are planning to become pregnant. If you drink alcohol: Limit how much you have to 0-1 drink a day. Know how much alcohol is in your drink. In the [place] one drink equals one 12 oz bottle of beer (355 mL), one 5 oz glass of wine (148 mL), or one 1 oz glass of hard liquor (44 mL). Lifestyle Brush your teeth every morning and night with fluoride toothpaste. Floss one time each day. Exercise for at least 30 minutes 5 or more days each week. Do not use any products that contain nicotine or tobacco. These products include cigarettes, chewing tobacco, and vaping devices, such as e-cigarettes. If you need help quitting, ask your health care provider. Do not use drugs. If you are sexually active, practice  safe sex. Use a condom or other form of protection to prevent STIs. If you do not wish to become pregnant, use a form of birth control. If  you plan to become pregnant, see your health care provider for a prepregnancy visit. Take aspirin only as told by your health care provider. Make sure that you understand how much to take and what form to take. Work with your health care provider to find out whether it is safe and beneficial for you to take aspirin daily. Find healthy ways to manage stress, such as: Meditation, yoga, or listening to music. Journaling. Talking to a trusted person. Spending time with friends and family. Minimize exposure to UV radiation to reduce your risk of skin cancer. Safety Always wear your seat belt while driving or riding in a vehicle. Do not drive: If you have been drinking alcohol. Do not ride with someone who has been drinking. When you are tired or distracted. While texting. If you have been using any mind-altering substances or drugs. Wear a helmet and other protective equipment during sports activities. If you have firearms in your house, make sure you follow all gun safety procedures. Seek help if you have been physically or sexually abused. What's next? Visit your health care provider once a year for an annual wellness visit. Ask your health care provider how often you should have your eyes and teeth checked. Stay up to date on all vaccines. This information is not intended to replace advice given to you by your health care provider. Make sure you discuss any questions you have with your health care provider. Document Revised: 06/24/2020 Document Reviewed: 06/24/2020 Elsevier Patient Education  [place].

## 2021-12-31 ENCOUNTER — Other Ambulatory Visit (HOSPITAL_COMMUNITY)
Admission: RE | Admit: 2021-12-31 | Discharge: 2021-12-31 | Disposition: A | Discharge status: Home / Self Care | Payer: BC Managed Care – PPO | Source: Ambulatory Visit | Attending: Family Medicine | Admitting: Family Medicine

## 2021-12-31 ENCOUNTER — Ambulatory Visit (INDEPENDENT_AMBULATORY_CARE_PROVIDER_SITE_OTHER): Payer: BC Managed Care – PPO | Admitting: Family Medicine

## 2021-12-31 ENCOUNTER — Encounter: Payer: Self-pay | Admitting: Family Medicine

## 2021-12-31 VITALS — BP 136/84 | HR 80 | Temp 98.2°F | Resp 16 | Ht 68.25 in | Wt 224.4 lb

## 2021-12-31 DIAGNOSIS — Z1211 Encounter for screening for malignant neoplasm of colon: Secondary | ICD-10-CM | POA: Diagnosis not present

## 2021-12-31 DIAGNOSIS — Z124 Encounter for screening for malignant neoplasm of cervix: Secondary | ICD-10-CM | POA: Insufficient documentation

## 2021-12-31 DIAGNOSIS — Z23 Encounter for immunization: Secondary | ICD-10-CM | POA: Diagnosis not present

## 2021-12-31 DIAGNOSIS — Z1231 Encounter for screening mammogram for malignant neoplasm of breast: Secondary | ICD-10-CM

## 2021-12-31 DIAGNOSIS — Z0000 Encounter for general adult medical examination without abnormal findings: Secondary | ICD-10-CM | POA: Diagnosis not present

## 2022-01-05 LAB — CYTOLOGY - PAP
Adequacy: ABSENT
Chlamydia: NEGATIVE
Comment: NEGATIVE
Comment: NEGATIVE
Comment: NORMAL
Diagnosis: NEGATIVE
High risk HPV: NEGATIVE
Neisseria Gonorrhea: NEGATIVE

## 2022-02-18 NOTE — Progress Notes (Unsigned)
Name: Cristina Bridges   MRN: [id number]    DOB: [birth year]   Date:02/18/2022       Progress Note  Subjective  Chief Complaint  Follow Up  HPI  HTN BP is at goal today She denies  chest pain , palpitation or sob Her alsosterone/renin and urine micro still high but improved  She cannot afford CT. Continue current regiment   Obesity:  BMI is now below 35, she has lost weight with Ozempic started Fall 2022 her weight was 233 lbs and  now is stable at 226 lbs , she has been taking Ozempic   1 mg dose and tolerating it well, she still works two jobs and eats fast food about twice a week.  She has noticed some constipation since started on medication , can go up to three days without a bowel movement but usually has a bowel movement every other day    Dyslipidemia: on life style modification only   The 10-year ASCVD risk score (Arnett DK, et al., 2019) is: 3.9%   Values used to calculate the score:     Age: 52 years     Sex: Female     Is Non-Hispanic African American: Yes     Diabetic: No     Tobacco smoker: No     Systolic Blood Pressure: 0000000 mmHg     Is BP treated: Yes     HDL Cholesterol: 54 mg/dL     Total Cholesterol: 175 mg/dL   Pre-diabetes: A1C used to be 5.7 % but is down to 5.3 %  Denies polyphagia, polydipsia or polyuria. Currently on Ozempic .  Patient Active Problem List   Diagnosis Date Noted   Microalbuminuria 04/02/2018   Postmenopausal 05/23/2017   Hot flashes, menopausal 03/03/2016   Acne vulgaris 09/24/2014   Cystic acne 09/24/2014   Benign essential hypertension 04/07/2014   Body mass index (BMI) of 38.0-38.9 in adult 04/07/2014   Mixed hyperlipidemia 04/07/2014    Past Surgical History:  Procedure Laterality Date   CESAREAN SECTION  05/29/2000   Age-60    Family History  Problem Relation Age of Onset   Diabetes Mellitus II Mother    Hypertension Mother    Bone cancer Father        Remission   Prostate cancer Father        Remission   Hypertension  Sister    Cancer Paternal Grandmother    Cancer Paternal Grandfather        Maybe Lung    Social History   Tobacco Use   Smoking status: Never   Smokeless tobacco: Never  Substance Use Topics   Alcohol use: Not Currently    Alcohol/week: 1.0 standard drink of alcohol    Types: 1 Glasses of wine per week    Comment: occasionally     Current Outpatient Medications:    amLODipine (NORVASC) 5 MG tablet, Take 1 tablet (5 mg total) by mouth daily., Disp: 90 tablet, Rfl: 1   Semaglutide, 1 MG/DOSE, (OZEMPIC, 1 MG/DOSE,) 4 MG/3ML SOPN, Inject 1 mg into the skin once a week., Disp: 9 mL, Rfl: 0   valsartan (DIOVAN) 80 MG tablet, Take 1 tablet (80 mg total) by mouth daily., Disp: 90 tablet, Rfl: 1  No Known Allergies  I personally reviewed active problem list, medication list, allergies, family history, social history, health maintenance with the patient/caregiver today.   ROS  ***  Objective  There were no vitals filed for this visit.  There  is no height or weight on file to calculate BMI.  Physical Exam ***  Recent Results (from the past 2160 hour(s))  Cytology - PAP     Status: None   Collection Time: 12/31/21  8:40 AM  Result Value Ref Range   High risk HPV Negative    Neisseria Gonorrhea Negative    Chlamydia Negative    Adequacy      Satisfactory for evaluation; transformation zone component ABSENT.   Diagnosis      - Negative for intraepithelial lesion or malignancy (NILM)   Comment Normal Reference Range HPV - Negative    Comment Normal Reference Ranger Chlamydia - Negative    Comment      Normal Reference Range Neisseria Gonorrhea - Negative    PHQ2/9:    12/31/2021    8:15 AM 11/17/2021    1:57 PM 08/16/2021    2:53 PM 04/07/2021    2:54 PM 12/30/2020    2:52 PM  Depression screen PHQ 2/9  Decreased Interest 0 0 0 0 0  Down, Depressed, Hopeless 0 0 0 0 0  PHQ - 2 Score 0 0 0 0 0  Altered sleeping 0 0  0 0  Tired, decreased energy 0 0  1 0  Change  in appetite 0 0  0 0  Feeling bad or failure about yourself  0 0  0 0  Trouble concentrating 0 0  0 0  Moving slowly or fidgety/restless 0 0  0 0  Suicidal thoughts 0 0  0 0  PHQ-9 Score 0 0  1 0  Difficult doing work/chores Not difficult at all    Not difficult at all    phq 9 is {gen pos [id number]   Fall Risk:    12/31/2021    8:15 AM 11/17/2021    1:56 PM 08/16/2021    2:53 PM 04/07/2021    2:54 PM 12/30/2020    2:51 PM  Fall Risk   Falls in the past year? 0 0 0 0 0  Number falls in past yr: 0 0  0 0  Injury with Fall? 0 0  0 0  Risk for fall due to : No Fall Risks No Fall Risks No Fall Risks No Fall Risks No Fall Risks  Follow up Falls prevention discussed;Education provided;Falls evaluation completed Falls prevention discussed Falls prevention discussed Falls prevention discussed Falls prevention discussed      Functional Status Survey:      Assessment & Plan  *** There are no diagnoses linked to this encounter.

## 2022-02-21 ENCOUNTER — Ambulatory Visit: Payer: BC Managed Care – PPO | Admitting: Family Medicine

## 2022-02-21 ENCOUNTER — Encounter: Payer: Self-pay | Admitting: Family Medicine

## 2022-02-21 VITALS — BP 160/102 | HR 86 | Resp 16 | Ht 68.0 in | Wt 224.0 lb

## 2022-02-21 DIAGNOSIS — I10 Essential (primary) hypertension: Secondary | ICD-10-CM

## 2022-02-21 DIAGNOSIS — R7303 Prediabetes: Secondary | ICD-10-CM | POA: Diagnosis not present

## 2022-02-21 DIAGNOSIS — R7989 Other specified abnormal findings of blood chemistry: Secondary | ICD-10-CM | POA: Diagnosis not present

## 2022-02-21 DIAGNOSIS — E669 Obesity, unspecified: Secondary | ICD-10-CM | POA: Diagnosis not present

## 2022-02-21 MED ORDER — SEMAGLUTIDE-WEIGHT MANAGEMENT 2.4 MG/0.75ML ~~LOC~~ SOAJ: 2.4000 mg | SUBCUTANEOUS | 1 refills | Status: DC

## 2022-02-21 MED ORDER — SEMAGLUTIDE-WEIGHT MANAGEMENT 1.7 MG/0.75ML ~~LOC~~ SOAJ: 1.7000 mg | SUBCUTANEOUS | 0 refills | Status: DC

## 2022-02-21 NOTE — Patient Instructions (Signed)
If wegovy is not covered please remind me to send rx for Zepbound

## 2022-02-28 ENCOUNTER — Ambulatory Visit: Payer: BC Managed Care – PPO

## 2022-02-28 VITALS — BP 120/76

## 2022-02-28 DIAGNOSIS — Z0130 Encounter for examination of blood pressure without abnormal findings: Secondary | ICD-10-CM

## 2022-03-03 LAB — COLOGUARD: COLOGUARD: NEGATIVE

## 2022-03-04 ENCOUNTER — Encounter: Payer: Self-pay | Admitting: Family Medicine

## 2022-03-13 ENCOUNTER — Other Ambulatory Visit: Payer: Self-pay | Admitting: Family Medicine

## 2022-03-13 MED ORDER — TIRZEPATIDE 7.5 MG/0.5ML ~~LOC~~ SOAJ: 7.5000 mg | SUBCUTANEOUS | 0 refills | Status: DC

## 2022-03-29 ENCOUNTER — Other Ambulatory Visit: Payer: Self-pay | Admitting: Family Medicine

## 2022-03-30 ENCOUNTER — Other Ambulatory Visit: Payer: Self-pay

## 2022-04-05 ENCOUNTER — Other Ambulatory Visit: Payer: Self-pay | Admitting: Family Medicine

## 2022-04-05 MED ORDER — MOUNJARO 7.5 MG/0.5ML ~~LOC~~ SOAJ: 7.5000 mg | SUBCUTANEOUS | 0 refills | Status: DC

## 2022-05-16 ENCOUNTER — Ambulatory Visit: Payer: BC Managed Care – PPO | Admitting: Family Medicine

## 2022-06-09 ENCOUNTER — Ambulatory Visit
Admission: RE | Admit: 2022-06-09 | Discharge: 2022-06-09 | Disposition: A | Discharge status: Home / Self Care | Payer: BC Managed Care – PPO | Source: Ambulatory Visit | Attending: Family Medicine | Admitting: Family Medicine

## 2022-06-09 DIAGNOSIS — Z1231 Encounter for screening mammogram for malignant neoplasm of breast: Secondary | ICD-10-CM | POA: Insufficient documentation

## 2022-10-31 ENCOUNTER — Other Ambulatory Visit: Payer: Self-pay | Admitting: Family Medicine

## 2022-10-31 DIAGNOSIS — I10 Essential (primary) hypertension: Secondary | ICD-10-CM

## 2023-01-09 ENCOUNTER — Ambulatory Visit (INDEPENDENT_AMBULATORY_CARE_PROVIDER_SITE_OTHER): Payer: BC Managed Care – PPO | Admitting: Family Medicine

## 2023-01-09 ENCOUNTER — Encounter: Payer: Self-pay | Admitting: Family Medicine

## 2023-01-09 VITALS — BP 124/72 | HR 85 | Temp 98.2°F | Resp 18 | Ht 68.0 in | Wt 229.0 lb

## 2023-01-09 DIAGNOSIS — Z23 Encounter for immunization: Secondary | ICD-10-CM | POA: Diagnosis not present

## 2023-01-09 DIAGNOSIS — Z1231 Encounter for screening mammogram for malignant neoplasm of breast: Secondary | ICD-10-CM

## 2023-01-09 DIAGNOSIS — R7989 Other specified abnormal findings of blood chemistry: Secondary | ICD-10-CM

## 2023-01-09 DIAGNOSIS — E782 Mixed hyperlipidemia: Secondary | ICD-10-CM

## 2023-01-09 DIAGNOSIS — Z0001 Encounter for general adult medical examination with abnormal findings: Secondary | ICD-10-CM | POA: Diagnosis not present

## 2023-01-09 DIAGNOSIS — R7303 Prediabetes: Secondary | ICD-10-CM

## 2023-01-09 DIAGNOSIS — R809 Proteinuria, unspecified: Secondary | ICD-10-CM | POA: Diagnosis not present

## 2023-01-09 DIAGNOSIS — I10 Essential (primary) hypertension: Secondary | ICD-10-CM | POA: Diagnosis not present

## 2023-01-09 DIAGNOSIS — Z0189 Encounter for other specified special examinations: Secondary | ICD-10-CM

## 2023-01-09 DIAGNOSIS — Z0000 Encounter for general adult medical examination without abnormal findings: Secondary | ICD-10-CM

## 2023-01-09 NOTE — Addendum Note (Signed)
Addended by: Ruel Favors on: 01/09/2023 09:28 AM   Modules accepted: Orders

## 2023-01-09 NOTE — Progress Notes (Addendum)
Name: Cristina Bridges   MRN: [id number]    DOB: [birth year]   Date:01/09/2023       Progress Note  Subjective  Chief Complaint  Chief Complaint  Patient presents with   Annual Exam    HPI  Patient presents for annual CPE.  Diet: she tries to pack her lunch a few times a week Exercise: discussed 150 minutes per week  Last Eye Exam: completed Last Dental Exam: completed  Flowsheet Row Office Visit from 01/09/2023 in [facility]  AUDIT-C Score 0      Depression: Phq 9 is  negative    02/21/2022    2:36 PM 12/31/2021    8:15 AM 11/17/2021    1:57 PM 08/16/2021    2:53 PM 04/07/2021    2:54 PM  Depression screen PHQ 2/9  Decreased Interest 0 0 0 0 0  Down, Depressed, Hopeless 0 0 0 0 0  PHQ - 2 Score 0 0 0 0 0  Altered sleeping 0 0 0  0  Tired, decreased energy 1 0 0  1  Change in appetite 0 0 0  0  Feeling bad or failure about yourself  0 0 0  0  Trouble concentrating 1 0 0  0  Moving slowly or fidgety/restless 0 0 0  0  Suicidal thoughts 0 0 0  0  PHQ-9 Score 2 0 0  1  Difficult doing work/chores  Not difficult at all      Hypertension: BP Readings from Last 3 Encounters:  01/09/23 124/72  02/28/22 120/76  02/21/22 (!) 160/102   Obesity: Wt Readings from Last 3 Encounters:  01/09/23 229 lb (103.9 kg)  02/21/22 224 lb (101.6 kg)  12/31/21 224 lb 6.4 oz (101.8 kg)   BMI Readings from Last 3 Encounters:  01/09/23 34.82 kg/m  02/21/22 34.06 kg/m  12/31/21 33.87 kg/m     Vaccines:  RSV: not applicable HPV: not applicable Tdap: completed Shingrix: completed Pneumonia: completed Flu: completed COVID-19: completed  Hep C Screening: completed STD testing and prevention (HIV/chl/gon/syphilis): not sexually active Intimate partner violence: negative screen  Sexual History : not sexually active - currently  Menstrual History/LMP/Abnormal Bleeding:  post menopausal since around 2019  Discussed importance of follow up if any  post-menopausal bleeding: yes  Incontinence Symptoms: positive for symptoms   Breast cancer:  - Last Mammogram: yearly, up to date  - BRCA gene screening:N/A  Osteoporosis Prevention : Discussed high calcium and vitamin D supplementation, weight bearing exercises Bone density :not applicable   Cervical cancer screening: up-to-date  Skin cancer: Discussed monitoring for atypical lesions  Colorectal cancer: up to date, cologuard 02/2022    Lung cancer:  Low Dose CT Chest recommended if Age 22-80 years, 20 pack-year currently smoking OR have quit w/in 15years. Patient does not qualify for screen   ECG: 2021  Advanced Care Planning: A voluntary discussion about advance care planning including the explanation and discussion of advance directives.  Discussed health care proxy and Living will, and the patient was able to identify a health care proxy as son.  Patient does not have a living will and power of attorney of health care   Patient Active Problem List   Diagnosis Date Noted   Microalbuminuria 04/02/2018   Postmenopausal 05/23/2017   Hot flashes, menopausal 03/03/2016   Acne vulgaris 09/24/2014   Benign essential hypertension 04/07/2014   Body mass index (BMI) of 38.0-38.9 in adult 04/07/2014   Mixed hyperlipidemia 04/07/2014  Past Surgical History:  Procedure Laterality Date   CESAREAN SECTION  05/29/2000   Age-56    Family History  Problem Relation Age of Onset   Diabetes Mellitus II Mother    Hypertension Mother    Bone cancer Father        Remission   Prostate cancer Father        Remission   Hypertension Sister    Cancer Paternal Grandmother    Cancer Paternal Grandfather        Maybe Lung    Social History   Socioeconomic History   Marital status: Single    Spouse name: Not on file   Number of children: 1   Years of education: Not on file   Highest education level: Associate degree: academic program  Occupational History   Occupation: payroll    Tobacco Use   Smoking status: Never   Smokeless tobacco: Never  Vaping Use   Vaping status: Never Used  Substance and Sexual Activity   Alcohol use: Not Currently    Alcohol/week: 1.0 standard drink of alcohol    Types: 1 Glasses of wine per week    Comment: occasionally   Drug use: Never   Sexual activity: Not Currently    Partners: Male    Birth control/protection: Post-menopausal  Other Topics Concern   Not on file  Social History Narrative   She works at [employer] at [employer]   Adult son    Social Drivers of Corporate investment banker Strain: Low Risk  (12/31/2021)   Overall Financial Resource Strain (CARDIA)    Difficulty of Paying Living Expenses: Not hard at all  Food Insecurity: No Food Insecurity (12/31/2021)   Hunger Vital Sign    Worried About Running Out of Food in the Last Year: Never true    Ran Out of Food in the Last Year: Never true  Transportation Needs: No Transportation Needs (12/31/2021)   PRAPARE - Administrator, Civil Service (Medical): No    Lack of Transportation (Non-Medical): No  Physical Activity: Insufficiently Active (12/31/2021)   Exercise Vital Sign    Days of Exercise per Week: 1 day    Minutes of Exercise per Session: 10 min  Stress: No Stress Concern Present (12/31/2021)   [employer] of Occupational Health - Occupational Stress Questionnaire    Feeling of Stress : Only a little  Social Connections: Moderately Isolated (12/31/2021)   Social Connection and Isolation Panel [NHANES]    Frequency of Communication with Friends and Family: Twice a week    Frequency of Social Gatherings with Friends and Family: Once a week    Attends Religious Services: 1 to 4 times per year    Active Member of [employer] or Organizations: No    Attends Banker Meetings: Never    Marital Status: Never married  Intimate Partner Violence: Not At Risk (12/31/2021)   Humiliation, Afraid, Rape, and Kick questionnaire    Fear of Current or  Ex-Partner: No    Emotionally Abused: No    Physically Abused: No    Sexually Abused: No     Current Outpatient Medications:    amLODipine (NORVASC) 5 MG tablet, TAKE 1 TABLET(5 MG) BY MOUTH DAILY, Disp: 90 tablet, Rfl: 0  No Known Allergies   ROS  Constitutional: Negative for fever , positive for weight change.  Respiratory: Negative for cough and shortness of breath.   Cardiovascular: Negative for chest pain or palpitations.  Gastrointestinal: Negative for abdominal  pain, no bowel changes.  Musculoskeletal: Negative for gait problem or joint swelling.  Skin: Negative for rash.  Neurological: Negative for dizziness or headache.  No other specific complaints in a complete review of systems (except as listed in HPI above).   Objective  Vitals:   01/09/23 0840  BP: 124/72  Pulse: 85  Resp: 18  Temp: 98.2 F (36.8 C)  SpO2: 98%  Weight: 229 lb (103.9 kg)  Height: 5\' 8"  (1.727 m)    Body mass index is 34.82 kg/m.  Physical Exam  Constitutional: Patient appears well-developed and well-nourished. No distress.  HENT: Head: Normocephalic and atraumatic. Ears: B TMs ok, no erythema or effusion; Nose: Nose normal. Mouth/Throat: Oropharynx is clear and moist. No oropharyngeal exudate.  Eyes: Conjunctivae and EOM are normal. Pupils are equal, round, and reactive to light. No scleral icterus.  Neck: Normal range of motion. Neck supple. No JVD present. some fullness and asymmetry between right and left side of neck, possible thyroid enlargement  Cardiovascular: Normal rate, regular rhythm and normal heart sounds.  No murmur heard. No BLE edema. Pulmonary/Chest: Effort normal and breath sounds normal. No respiratory distress. Abdominal: Soft. Bowel sounds are normal, no distension. There is no tenderness. no masses Breast: lumpy breast bewteen 10 and 11 o'clock on both side, stable - previous diagnostic and [place] negative in 2023 - today seems very symmetrical with right side, no  nipple discharge or rashes FEMALE GENITALIA:  Not done  RECTAL: not done  Musculoskeletal: Normal range of motion, no joint effusions. No gross deformities Neurological: he is alert and oriented to person, place, and time. No cranial nerve deficit. Coordination, balance, strength, speech and gait are normal.  Skin: Skin is warm and dry. No rash noted. No erythema.  Psychiatric: Patient has a normal mood and affect. behavior is normal. Judgment and thought content normal.     Assessment & Plan  1. Well adult exam (Primary)  - Lipid panel - Microalbumin / creatinine urine ratio - CBC with Differential/Platelet - COMPLETE METABOLIC PANEL WITH GFR - Hemoglobin A1c - Aldosterone + renin activity w/ ratio  2. Benign essential hypertension  - CBC with Differential/Platelet - COMPLETE METABOLIC PANEL WITH GFR  3. Mixed hyperlipidemia  - Lipid panel  4. Microalbuminuria  - Microalbumin / creatinine urine ratio  5. 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 Pre-diabetes  - Hemoglobin A1c  7. Breast cancer screening by mammogram  - MM 3D SCREENING MAMMOGRAM BILATERAL BREAST; Future   8. Encounter for imaging to assess thyroid enlargement  - US THYROID; Future  9. Needs flu shot  - Flu vaccine trivalent PF, 6mos and older(Flulaval,Afluria,Fluarix,Fluzone)   -USPSTF grade A and B recommendations reviewed with patient; age-appropriate recommendations, preventive care, screening tests, etc discussed and encouraged; healthy living encouraged; see AVS for patient education given to patient -Discussed importance of 150 minutes of physical activity weekly, eat two servings of fish weekly, eat one serving of tree nuts ( cashews, pistachios, pecans, almonds.Marland Kitchen) every other day, eat 6 servings of fruit/vegetables daily and drink plenty of water and avoid sweet beverages.   -Reviewed Health Maintenance: Yes.

## 2023-01-14 LAB — COMPLETE METABOLIC PANEL WITH GFR
AG Ratio: 1.8 (calc) (ref 1.0–2.5)
ALT: 31 U/L — ABNORMAL HIGH (ref 6–29)
AST: 25 U/L (ref 10–35)
Albumin: 4.5 g/dL (ref 3.6–5.1)
Alkaline phosphatase (APISO): 95 U/L (ref 37–153)
BUN: 11 mg/dL (ref 7–25)
CO2: 28 mmol/L (ref 20–32)
Calcium: 9.5 mg/dL (ref 8.6–10.4)
Chloride: 105 mmol/L (ref 98–110)
Creat: 0.59 mg/dL (ref 0.50–1.03)
Globulin: 2.5 g/dL (ref 1.9–3.7)
Glucose, Bld: 93 mg/dL (ref 65–99)
Potassium: 4 mmol/L (ref 3.5–5.3)
Sodium: 141 mmol/L (ref 135–146)
Total Bilirubin: 0.7 mg/dL (ref 0.2–1.2)
Total Protein: 7 g/dL (ref 6.1–8.1)
eGFR: 107 mL/min/{1.73_m2} (ref >=60)

## 2023-01-14 LAB — LIPID PANEL
Cholesterol: 224 mg/dL — ABNORMAL HIGH (ref <200)
HDL: 62 mg/dL (ref >=50)
LDL Cholesterol (Calc): 136 mg/dL — ABNORMAL HIGH
Non-HDL Cholesterol (Calc): 162 mg/dL — ABNORMAL HIGH (ref <130)
Total CHOL/HDL Ratio: 3.6 (calc) (ref <5.0)
Triglycerides: 137 mg/dL (ref <150)

## 2023-01-14 LAB — CBC WITH DIFFERENTIAL/PLATELET
Absolute Lymphocytes: 1908 {cells}/uL (ref 850–3900)
Absolute Monocytes: 447 {cells}/uL (ref 200–950)
Basophils Absolute: 21 {cells}/uL (ref 0–200)
Basophils Relative: 0.4 %
Eosinophils Absolute: 120 {cells}/uL (ref 15–500)
Eosinophils Relative: 2.3 %
HCT: 43.2 % (ref 35.0–45.0)
Hemoglobin: 14.1 g/dL (ref 11.7–15.5)
MCH: 30.1 pg (ref 27.0–33.0)
MCHC: 32.6 g/dL (ref 32.0–36.0)
MCV: 92.3 fL (ref 80.0–100.0)
MPV: 11.2 fL (ref 7.5–12.5)
Monocytes Relative: 8.6 %
Neutro Abs: 2704 {cells}/uL (ref 1500–7800)
Neutrophils Relative %: 52 %
Platelets: 344 10*3/uL (ref 140–400)
RBC: 4.68 10*6/uL (ref 3.80–5.10)
RDW: 13.1 % (ref 11.0–15.0)
Total Lymphocyte: 36.7 %
WBC: 5.2 10*3/uL (ref 3.8–10.8)

## 2023-01-14 LAB — MICROALBUMIN / CREATININE URINE RATIO
Creatinine, Urine: 160 mg/dL (ref 20–275)
Microalb Creat Ratio: 23 mg/g{creat} (ref <30)
Microalb, Ur: 3.7 mg/dL

## 2023-01-14 LAB — HEMOGLOBIN A1C
Hgb A1c MFr Bld: 5.8 %{Hb} — ABNORMAL HIGH (ref <5.7)
Mean Plasma Glucose: 120 mg/dL
eAG (mmol/L): 6.6 mmol/L

## 2023-01-14 LAB — ALDOSTERONE + RENIN ACTIVITY W/ RATIO
ALDO / PRA Ratio: 50 {ratio} — ABNORMAL HIGH (ref 0.9–28.9)
Aldosterone: 9 ng/dL
Renin Activity: 0.18 ng/mL/h — ABNORMAL LOW (ref 0.25–5.82)

## 2023-02-06 ENCOUNTER — Ambulatory Visit: Payer: BC Managed Care – PPO | Admitting: Family Medicine

## 2023-02-10 ENCOUNTER — Encounter: Payer: Self-pay | Admitting: Family Medicine

## 2023-02-10 ENCOUNTER — Ambulatory Visit: Payer: 59 | Admitting: Family Medicine

## 2023-02-10 VITALS — BP 144/94 | HR 89 | Resp 16 | Ht 68.0 in | Wt 226.9 lb

## 2023-02-10 DIAGNOSIS — R7989 Other specified abnormal findings of blood chemistry: Secondary | ICD-10-CM

## 2023-02-10 DIAGNOSIS — R809 Proteinuria, unspecified: Secondary | ICD-10-CM

## 2023-02-10 DIAGNOSIS — I10 Essential (primary) hypertension: Secondary | ICD-10-CM

## 2023-02-10 DIAGNOSIS — Z0189 Encounter for other specified special examinations: Secondary | ICD-10-CM

## 2023-02-10 DIAGNOSIS — E782 Mixed hyperlipidemia: Secondary | ICD-10-CM

## 2023-02-10 DIAGNOSIS — E66811 Obesity, class 1: Secondary | ICD-10-CM

## 2023-02-10 DIAGNOSIS — R7303 Prediabetes: Secondary | ICD-10-CM

## 2023-02-10 MED ORDER — ROSUVASTATIN CALCIUM 5 MG PO TABS: 5.0000 mg | ORAL_TABLET | Freq: Every day | ORAL | 0 refills | Status: DC

## 2023-02-10 MED ORDER — AMLODIPINE BESYLATE 5 MG PO TABS: 5.0000 mg | ORAL_TABLET | Freq: Every day | ORAL | 0 refills | Status: DC

## 2023-02-10 NOTE — Progress Notes (Signed)
Name: Cristina Bridges   MRN: [id number]    DOB: 29-Apr-1970   Date:02/10/2023       Progress Note  Subjective  Chief Complaint  Chief Complaint  Patient presents with   Medical Management of Chronic Issues    HTN- pt is not compliant with medication on a daily   HPI   HTN She denies  chest pain , palpitation or sob Her alsosterone/renin and urine micro normalized. HTN since her 38's and runs in her family , but explained risk of secondary hypertension and need to check renal US and check for adrenal mass. She states she cannot afford it at this time   Obesity:  BMI is now below 35, she has lost weight with Ozempic started Fall 2022 her weight was 233 lbs and  now is stable at 226 lbs , she has been taking Ozempic  1 mg dose and tolerating it well, she still works two jobs and eats fast food about twice a week, she has a treadmill and is going to start walking daily .  She has noticed some constipation but it has improved since last visit  , weight has now plateau and asked to go up on dose of Ozempic, explained no longer covered for weight loss but we can wegovy    Dyslipidemia: on life style modification only , explained importance to consider statin therapy , she is willing to start on crestor   The 10-year ASCVD risk score (Arnett DK, et al., 2019) is: 10.3%   Values used to calculate the score:     Age: 53 years     Sex: Female     Is Non-Hispanic African American: Yes     Diabetic: No     Tobacco smoker: No     Systolic Blood Pressure: 162 mmHg     Is BP treated: Yes     HDL Cholesterol: 62 mg/dL     Total Cholesterol: 224 mg/dL    Pre-diabetes: N5A  spiked again to 5.8 % , she denies polyphagia, polydipsia or polyuria . Discussed healthy diet, cut down on carbohydrates and exercise more   Patient Active Problem List   Diagnosis Date Noted   Microalbuminuria 04/02/2018   Postmenopausal 05/23/2017   Hot flashes, menopausal 03/03/2016   Acne vulgaris 09/24/2014   Benign  essential hypertension 04/07/2014   Body mass index (BMI) of 38.0-38.9 in adult 04/07/2014   Mixed hyperlipidemia 04/07/2014    Past Surgical History:  Procedure Laterality Date   CESAREAN SECTION  05/29/2000   Age-89    Family History  Problem Relation Age of Onset   Diabetes Mellitus II Mother    Hypertension Mother    Bone cancer Father        Remission   Prostate cancer Father        Remission   Hypertension Sister    Cancer Paternal Grandmother    Cancer Paternal Grandfather        Maybe Lung    Social History   Tobacco Use   Smoking status: Never   Smokeless tobacco: Never  Substance Use Topics   Alcohol use: Not Currently    Alcohol/week: 1.0 standard drink of alcohol    Types: 1 Glasses of wine per week    Comment: occasionally     Current Outpatient Medications:    amLODipine (NORVASC) 5 MG tablet, TAKE 1 TABLET(5 MG) BY MOUTH DAILY, Disp: 90 tablet, Rfl: 0  No Known Allergies  I personally reviewed active  problem list, medication list, allergies with the patient/caregiver today.   ROS  Ten systems reviewed and is negative except as mentioned in HPI    Objective  Vitals:   02/10/23 1321  BP: (!) 162/98  Pulse: 89  Resp: 16  SpO2: 98%  Weight: 226 lb 14.4 oz (102.9 kg)  Height: 5\' 8"  (1.727 m)    Body mass index is 34.5 kg/m.  Physical Exam  Constitutional: Patient appears well-developed and well-nourished. Obese  No distress.  HEENT: head atraumatic, normocephalic, pupils equal and reactive to light,, neck supple Cardiovascular: Normal rate, regular rhythm and normal heart sounds.  No murmur heard. No BLE edema. Pulmonary/Chest: Effort normal and breath sounds normal. No respiratory distress. Abdominal: Soft.  There is no tenderness. Psychiatric: Patient has a normal mood and affect. behavior is normal. Judgment and thought content normal.     Diabetic Foot Exam:     PHQ2/9:    02/10/2023    1:17 PM 02/21/2022    2:36 PM  12/31/2021    8:15 AM 11/17/2021    1:57 PM 08/16/2021    2:53 PM  Depression screen PHQ 2/9  Decreased Interest 0 0 0 0 0  Down, Depressed, Hopeless 0 0 0 0 0  PHQ - 2 Score 0 0 0 0 0  Altered sleeping 0 0 0 0   Tired, decreased energy 0 1 0 0   Change in appetite 0 0 0 0   Feeling bad or failure about yourself  0 0 0 0   Trouble concentrating 0 1 0 0   Moving slowly or fidgety/restless 0 0 0 0   Suicidal thoughts 0 0 0 0   PHQ-9 Score 0 2 0 0   Difficult doing work/chores Not difficult at all  Not difficult at all      phq 9 is negative  Fall Risk:    02/10/2023    1:17 PM 02/21/2022    2:36 PM 12/31/2021    8:15 AM 11/17/2021    1:56 PM 08/16/2021    2:53 PM  Fall Risk   Falls in the past year? 0 0 0 0 0  Number falls in past yr: 0 0 0 0   Injury with Fall? 0 0 0 0   Risk for fall due to : No Fall Risks No Fall Risks No Fall Risks No Fall Risks No Fall Risks  Follow up Falls prevention discussed;Education provided;Falls evaluation completed Falls prevention discussed Falls prevention discussed;Education provided;Falls evaluation completed Falls prevention discussed Falls prevention discussed     Assessment & Plan  1. Uncontrolled hypertension (Primary)  - amLODipine (NORVASC) 5 MG tablet; Take 1 tablet (5 mg total) by mouth daily.  Dispense: 90 tablet; Refill: 0  2. 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 Microalbuminuria  resolved  4. Mixed hyperlipidemia  - rosuvastatin (CRESTOR) 5 MG tablet; Take 1 tablet (5 mg total) by mouth daily.  Dispense: 90 tablet; Refill: 0  5. Pre-diabetes  Discussed diet  6. Obesity (BMI 30.0-34.9)  Discussed with the patient the risk posed by an increased BMI. Discussed importance of portion control, calorie counting and at least 150 minutes of physical activity weekly. Avoid sweet beverages and drink more water. Eat at least 6 servings of fruit and vegetables daily     8.  Encounter for imaging to assess thyroid enlargement  She will have it done before next visit

## 2023-02-24 ENCOUNTER — Ambulatory Visit: Payer: Self-pay

## 2023-02-24 DIAGNOSIS — I10 Essential (primary) hypertension: Secondary | ICD-10-CM

## 2023-02-24 NOTE — Progress Notes (Signed)
Patient is in office today for a nurse visit for Blood Pressure Check. Patient blood pressure was 140/92, Patient No chest pain, No shortness of breath, No dyspnea on exertion, No orthopnea, No paroxysmal nocturnal dyspnea, No edema, No palpitations, No syncope.  Currently on Norvasc 5mg .

## 2023-03-23 ENCOUNTER — Telehealth: Admitting: Physician Assistant

## 2023-03-23 DIAGNOSIS — J069 Acute upper respiratory infection, unspecified: Secondary | ICD-10-CM

## 2023-03-23 MED ORDER — BENZONATATE 100 MG PO CAPS: 100.0000 mg | ORAL_CAPSULE | Freq: Three times a day (TID) | ORAL | 0 refills | Status: DC | PRN

## 2023-03-23 NOTE — Progress Notes (Signed)
 I have spent 5 minutes in review of e-visit questionnaire, review and updating patient chart, medical decision making and response to patient.   Piedad Climes, PA-C

## 2023-03-23 NOTE — Progress Notes (Signed)

## 2023-04-26 ENCOUNTER — Other Ambulatory Visit: Payer: Self-pay | Admitting: Family Medicine

## 2023-04-26 DIAGNOSIS — I10 Essential (primary) hypertension: Secondary | ICD-10-CM

## 2023-05-09 ENCOUNTER — Other Ambulatory Visit: Payer: Self-pay | Admitting: Family Medicine

## 2023-05-09 DIAGNOSIS — E782 Mixed hyperlipidemia: Secondary | ICD-10-CM

## 2023-05-09 DIAGNOSIS — I10 Essential (primary) hypertension: Secondary | ICD-10-CM

## 2023-05-10 ENCOUNTER — Ambulatory Visit: Payer: Self-pay | Admitting: Family Medicine

## 2023-05-10 ENCOUNTER — Encounter: Payer: Self-pay | Admitting: Family Medicine

## 2023-05-10 VITALS — BP 132/94 | HR 91 | Resp 16 | Ht 68.0 in | Wt 231.0 lb

## 2023-05-10 DIAGNOSIS — R7989 Other specified abnormal findings of blood chemistry: Secondary | ICD-10-CM | POA: Diagnosis not present

## 2023-05-10 DIAGNOSIS — E2609 Other primary hyperaldosteronism: Secondary | ICD-10-CM | POA: Diagnosis not present

## 2023-05-10 DIAGNOSIS — R7303 Prediabetes: Secondary | ICD-10-CM

## 2023-05-10 DIAGNOSIS — R809 Proteinuria, unspecified: Secondary | ICD-10-CM

## 2023-05-10 DIAGNOSIS — E782 Mixed hyperlipidemia: Secondary | ICD-10-CM

## 2023-05-10 DIAGNOSIS — I10 Essential (primary) hypertension: Secondary | ICD-10-CM

## 2023-05-10 MED ORDER — ROSUVASTATIN CALCIUM 5 MG PO TABS: 5.0000 mg | ORAL_TABLET | Freq: Every day | ORAL | 0 refills | Status: DC

## 2023-05-10 MED ORDER — AMLODIPINE BESYLATE 10 MG PO TABS: 10.0000 mg | ORAL_TABLET | Freq: Every day | ORAL | 0 refills | Status: DC

## 2023-05-10 MED ORDER — METFORMIN HCL ER 500 MG PO TB24: 500.0000 mg | ORAL_TABLET | Freq: Every day | ORAL | 0 refills | Status: DC

## 2023-05-10 NOTE — Progress Notes (Signed)
 Name: Cristina Bridges   MRN: [id number]    DOB: Oct 01, 1970   Date:05/10/2023       Progress Note  Subjective  Chief Complaint  Chief Complaint  Patient presents with   Medical Management of Chronic Issues    Patients states maybe 1 day out of the week she forget to take it BID.   Discussed the use of AI scribe software for clinical note transcription with the patient, who gave verbal consent to proceed.  History of Present Illness Cristina Bridges is a 53 year old female with hypertension who presents with uncontrolled blood pressure.  She has a history of hypertension that has been difficult to control. Her blood pressure was high during a visit in January, leading to an increase in her amlodipine  dosage to twice a day. However, a request for additional medication did not go through, and she has been out of medication at times. Despite taking her blood pressure medication today, her blood pressure remains elevated at 154/98 mmHg.  Recent blood work revealed an abnormal creatinine and renin-aldosterone ratio. She has not had a CT scan of the adrenal glands before. Her potassium levels were checked and found to be normal at 4 mmol/L.   She is currently taking amlodipine  for her blood pressure, which is being adjusted to a single 10 mg dose daily. She also takes rosuvastatin  for dyslipidemia, which she tolerates well without side effects.  Her past medical history includes prediabetes with an A1c of 5.8%. She experiences frequent urination when taking her blood pressure medication but has no headaches or chest pain. She previously took Ozempic  for weight loss but discontinued it due to cost. She is not currently on any weight loss medication.  In terms of social history, she works two jobs and reports feeling tired and lacking energy, which she attributes to her busy schedule. She denies feeling depressed but notes a lack of energy and motivation.   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   Pre-diabetes 05/10/2023   Microalbuminuria 04/02/2018   Morbid obesity (HCC) 03/28/2018   Postmenopausal 05/23/2017   Hot flashes, menopausal 03/03/2016   Acne vulgaris 09/24/2014   Uncontrolled hypertension 04/07/2014   Body mass index (BMI) of 38.0-38.9 in adult 04/07/2014   Mixed hyperlipidemia 04/07/2014    Past Surgical History:  Procedure Laterality Date   CESAREAN SECTION  05/29/2000   Age-60    Family History  Problem Relation Age of Onset   Diabetes Mellitus II Mother    Hypertension Mother    Bone cancer Father        Remission   Prostate cancer Father        Remission   Hypertension Sister    Cancer Paternal Grandmother    Cancer Paternal Grandfather        Maybe Lung    Social History   Tobacco Use   Smoking status: Never   Smokeless tobacco: Never  Substance Use Topics   Alcohol use: Not Currently    Alcohol/week: 1.0 standard drink of alcohol    Types: 1 Glasses of wine per week    Comment: occasionally     Current Outpatient Medications:    metFORMIN (GLUCOPHAGE-XR) 500 MG 24 hr tablet, Take 1 tablet (500 mg total) by mouth daily with breakfast., Disp: 90 tablet, Rfl: 0   amLODipine  (NORVASC ) 10 MG tablet, Take 1 tablet (10 mg total)  by mouth daily., Disp: 90 tablet, Rfl: 0   rosuvastatin  (CRESTOR ) 5 MG tablet, Take 1 tablet (5 mg total) by mouth daily., Disp: 90 tablet, Rfl: 0  No Known Allergies  I personally reviewed active problem list, medication list, allergies with the patient/caregiver today.   ROS  Ten systems reviewed and is negative except as mentioned in HPI    Objective Physical Exam  CONSTITUTIONAL: Patient appears well-developed and well-nourished. No distress. HEENT: Head atraumatic, normocephalic, neck supple. CARDIOVASCULAR: Normal rate, regular rhythm and normal heart sounds. No murmur heard. No BLE edema. PULMONARY:  Effort normal and breath sounds normal. No respiratory distress. ABDOMINAL: There is no tenderness or distention. MUSCULOSKELETAL: Normal gait. Without gross motor or sensory deficit. PSYCHIATRIC: Patient has a normal mood and affect. Behavior is normal. Judgment and thought content normal.  Vitals:   05/10/23 1321 05/10/23 1357  BP: (!) 154/98 (!) 132/94  Pulse: 91   Resp: 16   SpO2: 97%   Weight: 231 lb (104.8 kg)   Height: 5\' 8"  (1.727 m)     Body mass index is 35.12 kg/m.   PHQ2/9:    05/10/2023    1:19 PM 02/10/2023    1:17 PM 02/21/2022    2:36 PM 12/31/2021    8:15 AM 11/17/2021    1:57 PM  Depression screen PHQ 2/9  Decreased Interest 0 0 0 0 0  Down, Depressed, Hopeless 0 0 0 0 0  PHQ - 2 Score 0 0 0 0 0  Altered sleeping 0 0 0 0 0  Tired, decreased energy 0 0 1 0 0  Change in appetite 0 0 0 0 0  Feeling bad or failure about yourself  0 0 0 0 0  Trouble concentrating 0 0 1 0 0  Moving slowly or fidgety/restless 0 0 0 0 0  Suicidal thoughts 0 0 0 0 0  PHQ-9 Score 0 0 2 0 0  Difficult doing work/chores Not difficult at all Not difficult at all  Not difficult at all     phq 9 is negative  Fall Risk:    02/10/2023    1:17 PM 02/21/2022    2:36 PM 12/31/2021    8:15 AM 11/17/2021    1:56 PM 08/16/2021    2:53 PM  Fall Risk   Falls in the past year? 0 0 0 0 0  Number falls in past yr: 0 0 0 0   Injury with Fall? 0 0 0 0   Risk for fall due to : No Fall Risks No Fall Risks No Fall Risks No Fall Risks No Fall Risks  Follow up Falls prevention discussed;Education provided;Falls evaluation completed Falls prevention discussed Falls prevention discussed;Education provided;Falls evaluation completed Falls prevention discussed Falls prevention discussed     Assessment & Plan Hypertension Uncontrolled hypertension with suspected secondary cause due to primary aldosteronism. - Increase amlodipine  to 10 mg once daily. - Order CT scan of the adrenal glands. - Provide  educational material on primary aldosteronism. - Consider referral to endocrinologist or surgeon based on CT results.  Suspected primary aldosteronism Abnormal renin-aldosterone ratio suggests primary aldosteronism, possibly due to adrenal adenoma or hyperplasia. - Order CT scan of the adrenal glands. - Provide educational material on primary aldosteronism. - Consider referral to endocrinologist or surgeon based on CT results.  Dyslipidemia Managed with rosuvastatin , well-tolerated without side effects. - Continue rosuvastatin  as prescribed. - Monitor lipid levels at next visit.  Prediabetes A1c of 5.8% indicates prediabetes. Discussed metformin benefits  for appetite control and weight loss. - Prescribe metformin for prediabetes.  Morbid Obesity BMI over 35 with HTN and dyslipidemia  Discussed weight loss challenges and potential insurance coverage for medications. - Encourage lifestyle modifications including increased physical activity and dietary changes. - Discuss potential insurance coverage for obesity medications.

## 2023-06-09 ENCOUNTER — Ambulatory Visit: Admission: RE | Admit: 2023-06-09 | Source: Ambulatory Visit

## 2023-07-06 ENCOUNTER — Encounter

## 2023-07-07 ENCOUNTER — Ambulatory Visit
Admission: RE | Admit: 2023-07-07 | Discharge: 2023-07-07 | Disposition: A | Discharge status: Home / Self Care | Source: Ambulatory Visit | Attending: Family Medicine | Admitting: Family Medicine

## 2023-07-07 DIAGNOSIS — Z1231 Encounter for screening mammogram for malignant neoplasm of breast: Secondary | ICD-10-CM | POA: Insufficient documentation

## 2023-07-12 ENCOUNTER — Ambulatory Visit: Payer: Self-pay | Admitting: Family Medicine

## 2023-08-05 ENCOUNTER — Other Ambulatory Visit: Payer: Self-pay | Admitting: Family Medicine

## 2023-08-05 DIAGNOSIS — R7303 Prediabetes: Secondary | ICD-10-CM

## 2023-08-05 DIAGNOSIS — I10 Essential (primary) hypertension: Secondary | ICD-10-CM

## 2023-08-07 NOTE — Telephone Encounter (Signed)
 Requested Prescriptions  Pending Prescriptions Disp Refills   amLODipine  (NORVASC ) 10 MG tablet [Pharmacy Med Name: AMLODIPINE  BESYLATE 10 MG TAB] 90 tablet 0    Sig: TAKE 1 TABLET BY MOUTH EVERY DAY     Cardiovascular: Calcium  Channel Blockers 2 Failed - 08/07/2023  2:32 PM      Failed - Last BP in normal range    BP Readings from Last 1 Encounters:  05/10/23 (!) 132/94         Passed - Last Heart Rate in normal range    Pulse Readings from Last 1 Encounters:  05/10/23 91         Passed - Valid encounter within last 6 months    Recent Outpatient Visits           2 months ago Uncontrolled hypertension   [facility] [place] Mire, MD       Future Appointments             In 5 months Sowles, Krichna, MD [facility]             metFORMIN  (GLUCOPHAGE -XR) 500 MG 24 hr tablet [Pharmacy Med Name: METFORMIN  HCL ER 500 MG TABLET] 90 tablet 0    Sig: TAKE 1 TABLET BY MOUTH EVERY DAY WITH BREAKFAST     Endocrinology:  Diabetes - Biguanides Failed - 08/07/2023  2:32 PM      Failed - HBA1C is between 0 and 7.9 and within 180 days    Hgb A1c MFr Bld  Date Value Ref Range Status  01/09/2023 5.8 (H) <5.7 % of total Hgb Final    Comment:    For someone without known diabetes, a hemoglobin  A1c value between 5.7% and 6.4% is consistent with prediabetes and should be confirmed with a  follow-up test. . For someone with known diabetes, a value <7% indicates that their diabetes is well controlled. A1c targets should be individualized based on duration of diabetes, age, comorbid conditions, and other considerations. . This assay result is consistent with an increased risk of diabetes. . Currently, no consensus exists regarding use of hemoglobin A1c for diagnosis of diabetes for children. .          Failed - B12 Level in normal range and within 720 days    No results found for: VITAMINB12       Passed - Cr in  normal range and within 360 days    Creat  Date Value Ref Range Status  01/09/2023 0.59 0.50 - 1.03 mg/dL Final   Creatinine, Urine  Date Value Ref Range Status  01/09/2023 160 20 - 275 mg/dL Final         Passed - eGFR in normal range and within 360 days    GFR, Est African American  Date Value Ref Range Status  07/07/2020 120 > OR = 60 mL/min/1.40m2 Final   GFR, Est Non African American  Date Value Ref Range Status  07/07/2020 103 > OR = 60 mL/min/1.70m2 Final   eGFR  Date Value Ref Range Status  01/09/2023 107 > OR = 60 mL/min/1.43m2 Final         Passed - Valid encounter within last 6 months    Recent Outpatient Visits           2 months ago Uncontrolled hypertension   [facility] Glenard Mire, MD       Future Appointments  In 5 months Sowles, Krichna, MD [facility]            Passed - CBC within normal limits and completed in the last 12 months    WBC  Date Value Ref Range Status  01/09/2023 5.2 3.8 - 10.8 Thousand/uL Final   RBC  Date Value Ref Range Status  01/09/2023 4.68 3.80 - 5.10 Million/uL Final   Hemoglobin  Date Value Ref Range Status  01/09/2023 14.1 11.7 - 15.5 g/dL Final   HCT  Date Value Ref Range Status  01/09/2023 43.2 35.0 - 45.0 % Final   MCHC  Date Value Ref Range Status  01/09/2023 32.6 32.0 - 36.0 g/dL Final    Comment:    For adults, a slight decrease in the calculated MCHC value (in the range of 30 to 32 g/dL) is most likely not clinically significant; however, it should be interpreted with caution in correlation with other red cell parameters and the patient's clinical condition.    [facility]  Date Value Ref Range Status  01/09/2023 30.1 27.0 - 33.0 pg Final   MCV  Date Value Ref Range Status  01/09/2023 92.3 80.0 - 100.0 fL Final   No results found for: PLTCOUNTKUC, LABPLAT, POCPLA RDW  Date Value Ref Range Status  01/09/2023 13.1  11.0 - 15.0 % Final

## 2023-08-09 ENCOUNTER — Ambulatory Visit: Admitting: Family Medicine

## 2023-09-08 ENCOUNTER — Ambulatory Visit: Admitting: Family Medicine

## 2023-10-11 ENCOUNTER — Ambulatory Visit: Admitting: Family Medicine

## 2023-10-11 VITALS — BP 152/92 | HR 92 | Resp 16 | Ht 68.0 in | Wt 238.1 lb

## 2023-10-11 DIAGNOSIS — E7800 Pure hypercholesterolemia, unspecified: Secondary | ICD-10-CM

## 2023-10-11 DIAGNOSIS — Z23 Encounter for immunization: Secondary | ICD-10-CM | POA: Diagnosis not present

## 2023-10-11 DIAGNOSIS — I10 Essential (primary) hypertension: Secondary | ICD-10-CM | POA: Diagnosis not present

## 2023-10-11 DIAGNOSIS — R809 Proteinuria, unspecified: Secondary | ICD-10-CM

## 2023-10-11 DIAGNOSIS — R7303 Prediabetes: Secondary | ICD-10-CM

## 2023-10-11 DIAGNOSIS — E2609 Other primary hyperaldosteronism: Secondary | ICD-10-CM | POA: Diagnosis not present

## 2023-10-11 MED ORDER — METFORMIN HCL ER 500 MG PO TB24: 500.0000 mg | ORAL_TABLET | Freq: Every day | ORAL | 0 refills | Status: DC

## 2023-10-11 MED ORDER — TRIAMTERENE-HCTZ 37.5-25 MG PO TABS: 1.0000 | ORAL_TABLET | Freq: Every day | ORAL | 0 refills | Status: DC

## 2023-10-11 MED ORDER — AMLODIPINE BESYLATE 10 MG PO TABS: 10.0000 mg | ORAL_TABLET | Freq: Every day | ORAL | 0 refills | Status: DC

## 2023-10-11 MED ORDER — ROSUVASTATIN CALCIUM 5 MG PO TABS: 5.0000 mg | ORAL_TABLET | Freq: Every day | ORAL | 0 refills | Status: DC

## 2023-10-11 NOTE — Progress Notes (Signed)
 Name: Cristina Bridges   MRN: [id number]    DOB: 1970-03-15   Date:10/11/2023       Progress Note  Subjective  Chief Complaint  Chief Complaint  Patient presents with   Medical Management of Chronic Issues   Discussed the use of AI scribe software for clinical note transcription with the patient, who gave verbal consent to proceed.  History of Present Illness Cristina Bridges is a 53 year old female with hypertension and hyperaldosteronism who presents for blood pressure management.  She has  hypertension with blood pressure consistently over 150/90 mmHg, likely secondary to hyperaldosteronemia. She is currently taking amlodipine  for blood pressure management. Her aldosterone levels have been normal, and her aldosterone-renin activity ratio has been elevated for years . She has not been able to afford a CT scan to further evaluate her adrenal glands also not able to see Endocrinologist.  She denies chest pain, palpitation or SOB  She has a history of proteinuria. She has had normal kidney function and sugar levels, and her liver enzymes have been slightly above normal.  She takes rosuvastatin  5 mg for cholesterol management, but she recently ran out of it. Her cholesterol levels have shown higher LDL and good HDL levels. She is also on metformin  for prediabetes and experiences nocturia but no other symptoms of prediabetes.  She mentions occasional knee pain, which resolved on its own and was not persistent.  She has a BMI over 35 and acknowledges weight gain due to poor dietary habits, often consuming fast food due to her work schedule. She works two jobs and finds it challenging to prepare meals at home.     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   Pre-diabetes 05/10/2023   Microalbuminuria 04/02/2018   Morbid obesity (HCC) 03/28/2018   Postmenopausal 05/23/2017   Hot flashes, menopausal  03/03/2016   Acne vulgaris 09/24/2014   Uncontrolled hypertension 04/07/2014   Body mass index (BMI) of 38.0-38.9 in adult 04/07/2014   Mixed hyperlipidemia 04/07/2014    Past Surgical History:  Procedure Laterality Date   CESAREAN SECTION  05/29/2000   Age-27    Family History  Problem Relation Age of Onset   Diabetes Mellitus II Mother    Hypertension Mother    Bone cancer Father        Remission   Prostate cancer Father        Remission   Hypertension Sister    Cancer Paternal Grandmother    Cancer Paternal Grandfather        Maybe Lung    Social History   Tobacco Use   Smoking status: Never   Smokeless tobacco: Never  Substance Use Topics   Alcohol use: Not Currently    Alcohol/week: 1.0 standard drink of alcohol    Types: 1 Glasses of wine per week    Comment: occasionally     Current Outpatient Medications:    amLODipine  (NORVASC ) 10 MG tablet, TAKE 1 TABLET BY MOUTH EVERY DAY, Disp: 90 tablet, Rfl: 0   metFORMIN  (GLUCOPHAGE -XR) 500 MG 24 hr tablet, TAKE 1 TABLET BY MOUTH EVERY DAY WITH BREAKFAST (Patient not taking: Reported on 10/11/2023), Disp: 90 tablet, Rfl: 0   rosuvastatin  (CRESTOR ) 5 MG tablet, Take 1 tablet (5 mg total) by mouth daily. (Patient not taking: Reported on 10/11/2023), Disp: 90 tablet, Rfl: 0  No Known Allergies  I personally reviewed active problem list, medication  list, allergies with the patient/caregiver today.   ROS  Ten systems reviewed and is negative except as mentioned in HPI    Objective Physical Exam  CONSTITUTIONAL: Patient appears well-developed and well-nourished.  No distress. HEENT: Head atraumatic, normocephalic, neck supple. CARDIOVASCULAR: Normal rate, regular rhythm and normal heart sounds.  No murmur heard. No BLE edema. PULMONARY: Effort normal and breath sounds normal. No respiratory distress. ABDOMINAL: There is no tenderness or distention. MUSCULOSKELETAL: Normal gait. Without gross motor or sensory  deficit. PSYCHIATRIC: Patient has a normal mood and affect. behavior is normal. Judgment and thought content normal.  Vitals:   10/11/23 1333  BP: (!) 152/92  Pulse: 92  Resp: 16  SpO2: 99%  Weight: 238 lb 1.6 oz (108 kg)  Height: 5' 8 (1.727 m)    Body mass index is 36.2 kg/m.   PHQ2/9:    10/11/2023    1:30 PM 05/10/2023    1:19 PM 02/10/2023    1:17 PM 02/21/2022    2:36 PM 12/31/2021    8:15 AM  Depression screen PHQ 2/9  Decreased Interest 0 0 0 0 0  Down, Depressed, Hopeless 0 0 0 0 0  PHQ - 2 Score 0 0 0 0 0  Altered sleeping  0 0 0 0  Tired, decreased energy  0 0 1 0  Change in appetite  0 0 0 0  Feeling bad or failure about yourself   0 0 0 0  Trouble concentrating  0 0 1 0  Moving slowly or fidgety/restless  0 0 0 0  Suicidal thoughts  0 0 0 0  PHQ-9 Score  0 0 2 0  Difficult doing work/chores  Not difficult at all Not difficult at all  Not difficult at all    phq 9 is negative  Fall Risk:    10/11/2023    1:30 PM 02/10/2023    1:17 PM 02/21/2022    2:36 PM 12/31/2021    8:15 AM 11/17/2021    1:56 PM  Fall Risk   Falls in the past year? 0 0 0 0 0  Number falls in past yr: 0 0 0 0 0  Injury with Fall? 0 0 0 0 0  Risk for fall due to : No Fall Risks No Fall Risks No Fall Risks No Fall Risks No Fall Risks  Follow up Falls evaluation completed Falls prevention discussed;Education provided;Falls evaluation completed Falls prevention discussed Falls prevention discussed;Education provided;Falls evaluation completed  Falls prevention discussed      Data saved with a previous flowsheet row definition      Assessment & Plan Primary hyperaldosteronism with secondary hypertension Primary hyperaldosteronism causing uncontrolled secondary hypertension. Elevated aldosterone-renin ratio indicates adrenal overactivity. Financial constraints limit diagnostic options. - Continue amlodipine   - Educated on ARB side effects, including angioedema, and advised on actions  if symptoms occur, patient afraid of trying the calss, therefore we will give her Maxzide instead  - Schedule follow-up in January for blood pressure management. - Consider CT scan of adrenal glands when affordable.  Morbid obesity BMI over 35 contributing to hypertension, joint pain, and prediabetes. Weight gain linked to fast food consumption. - Discussed meal prepping and healthier food choices, such as salads and soups from grocery stores. - Suggested exploring [employer] for pre-made healthy meals. - Encouraged drinking water throughout the day to avoid nighttime thirst.  Prediabetes Managed with metformin . Nocturia possibly due to increased evening water intake. - Continue metformin  for prediabetes. - Advised drinking  fluids during the day to reduce nighttime urination.  Dyslipidemia Suboptimal cholesterol levels with elevated LDL. Previously on rosuvastatin , willing to continue. - Prescribe rosuvastatin  5 mg for dyslipidemia.

## 2023-11-25 IMAGING — MG DIGITAL DIAGNOSTIC BILAT W/ TOMO W/ CAD
6 of 12 series · 6 of 36 positions shown · non-contrast
Comparison: Previous exam(s).

CLINICAL DATA: Patient presents for palpable mass within the medial
left breast.

EXAM:
DIGITAL DIAGNOSTIC BILATERAL MAMMOGRAM WITH TOMOSYNTHESIS AND CAD;
ULTRASOUND LEFT BREAST LIMITED
TECHNIQUE: Bilateral digital diagnostic mammography and breast tomosynthesis
was performed. The images were evaluated with computer-aided
detection.; Targeted ultrasound examination of the left breast was
performed.

[L CC synth-2D (1 of 3)]
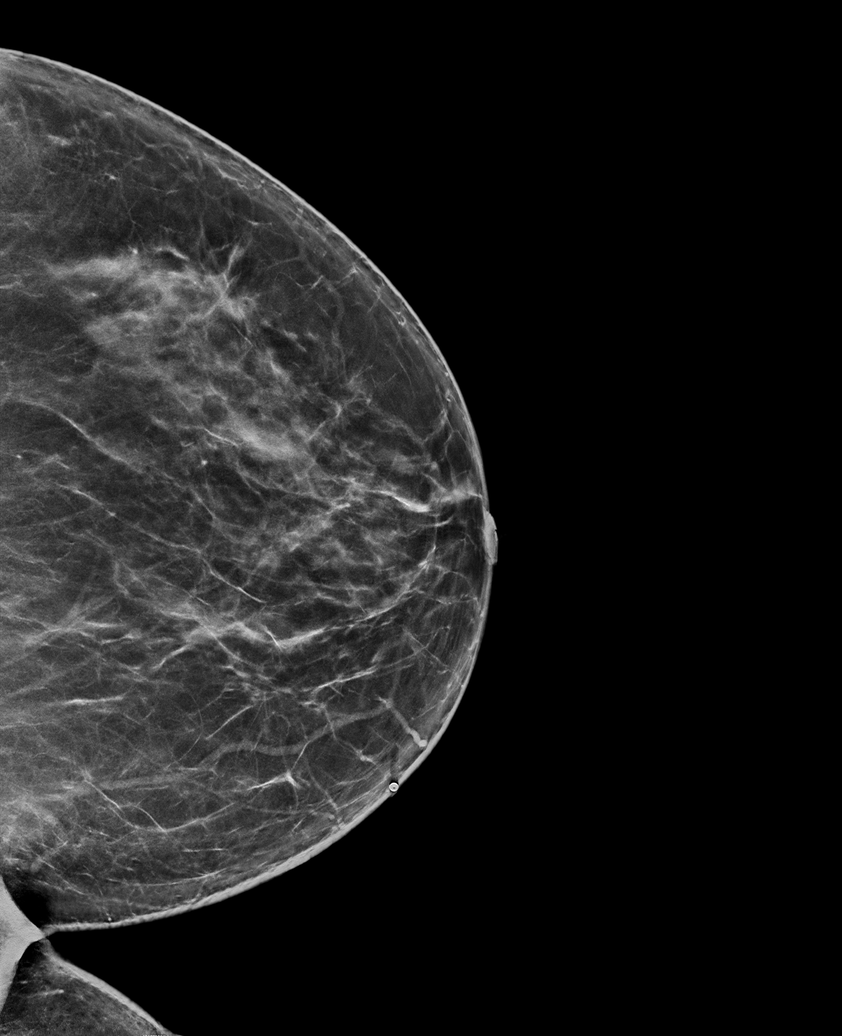

[L CC synth-2D (2 of 3)]
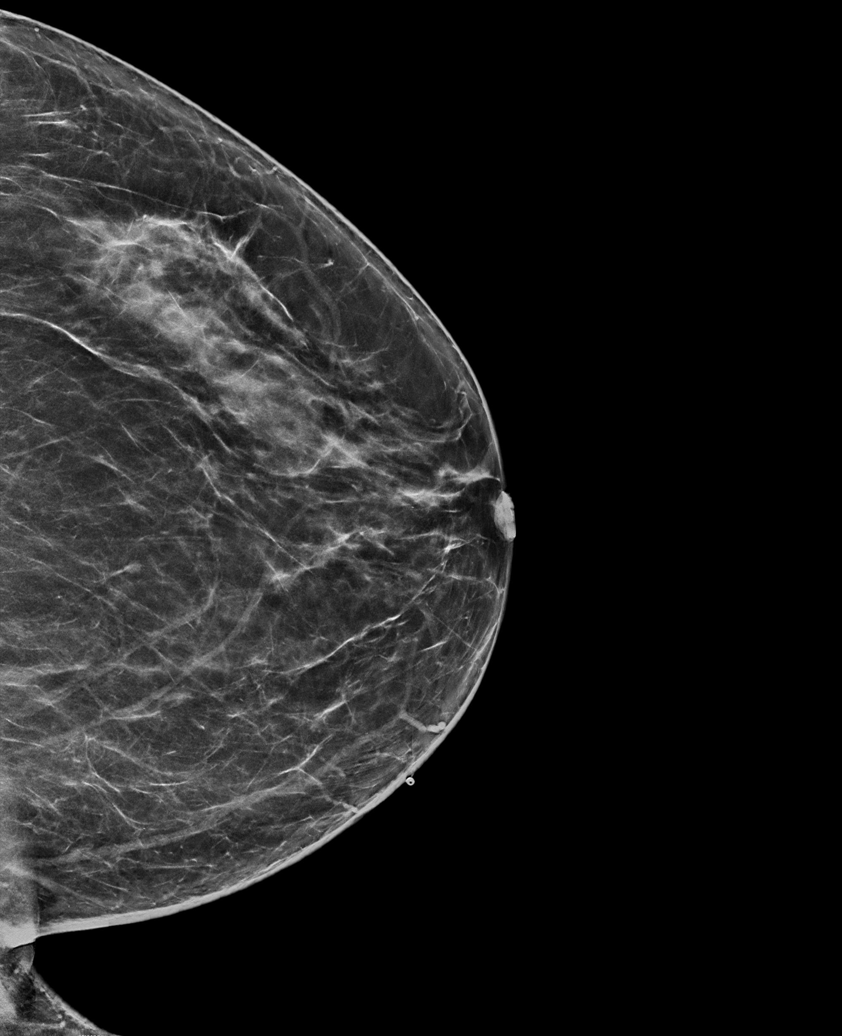

[L CC synth-2D (3 of 3)]
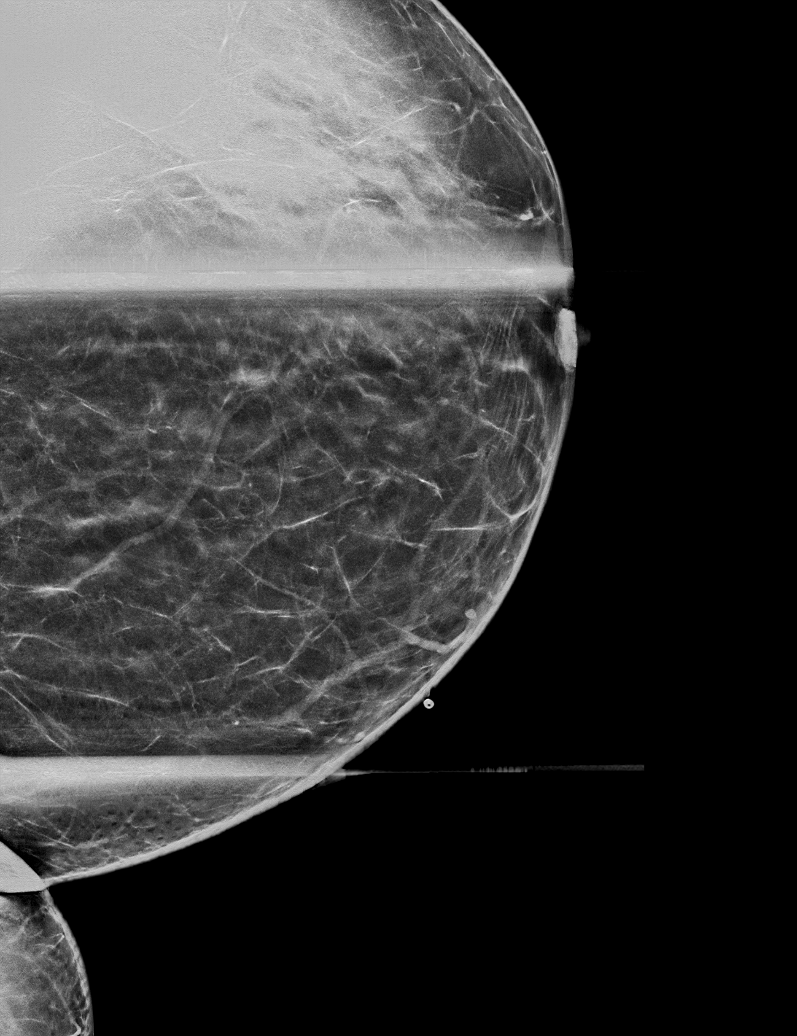

[R MLO synth-2D]
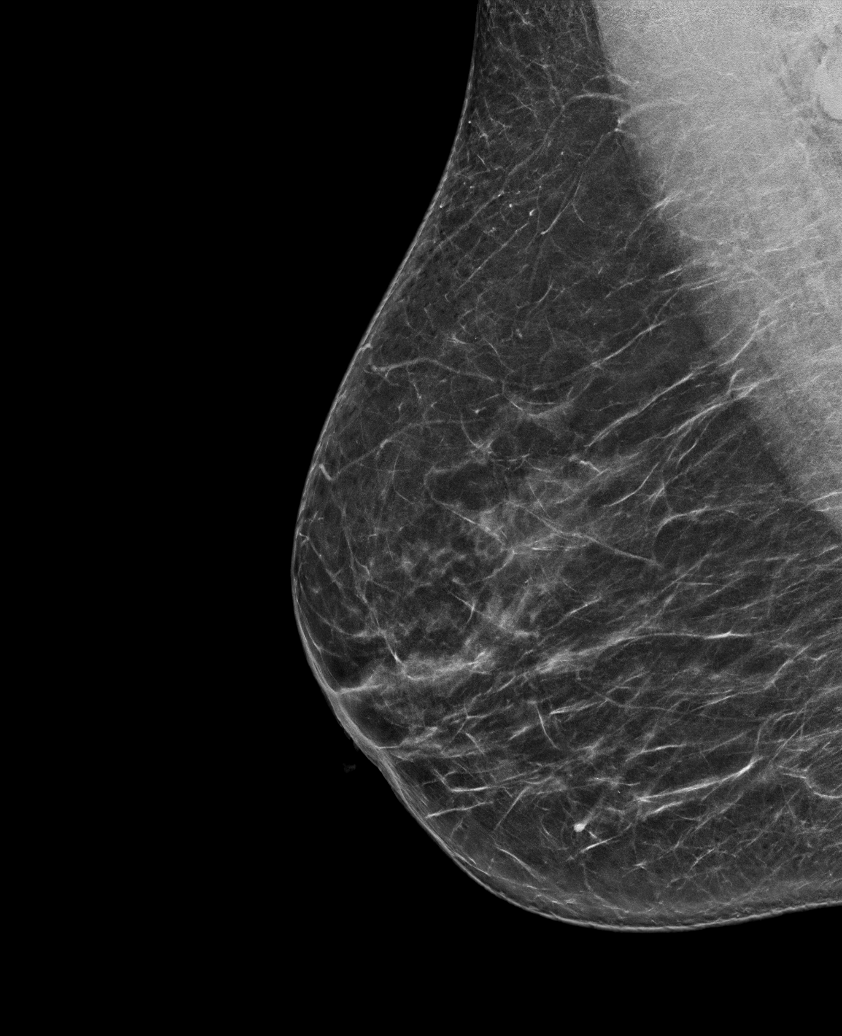

[L MLO synth-2D]
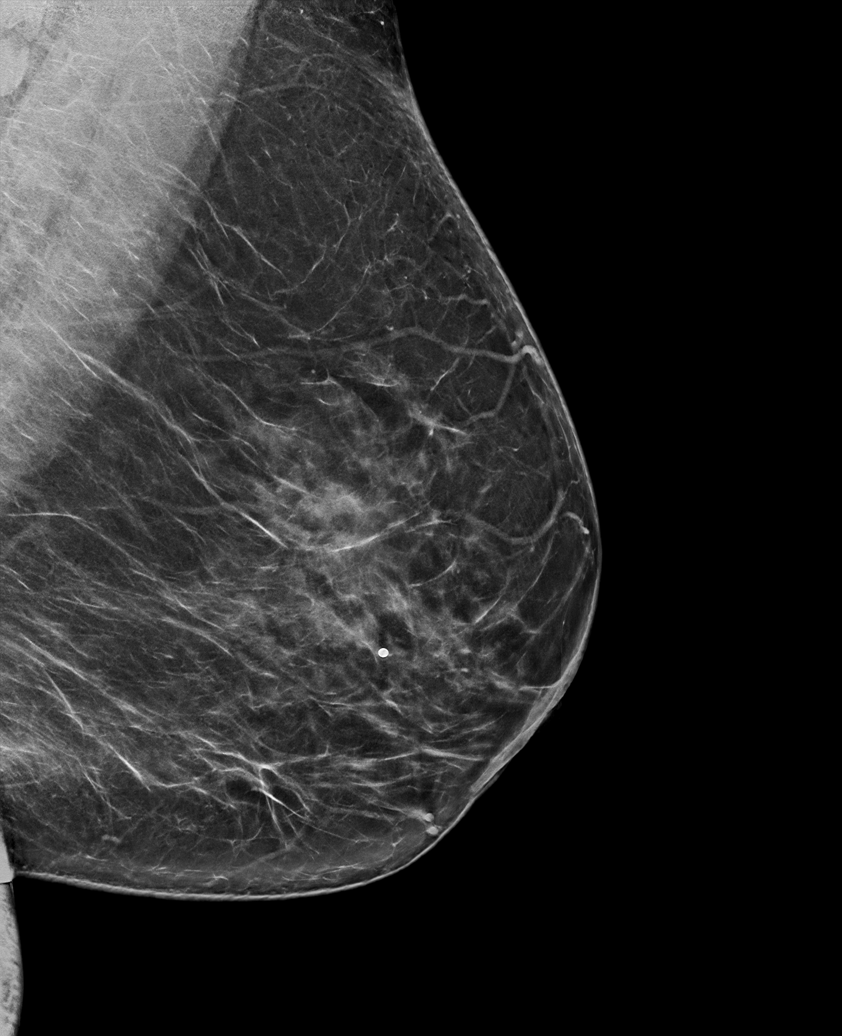

[R CC synth-2D]
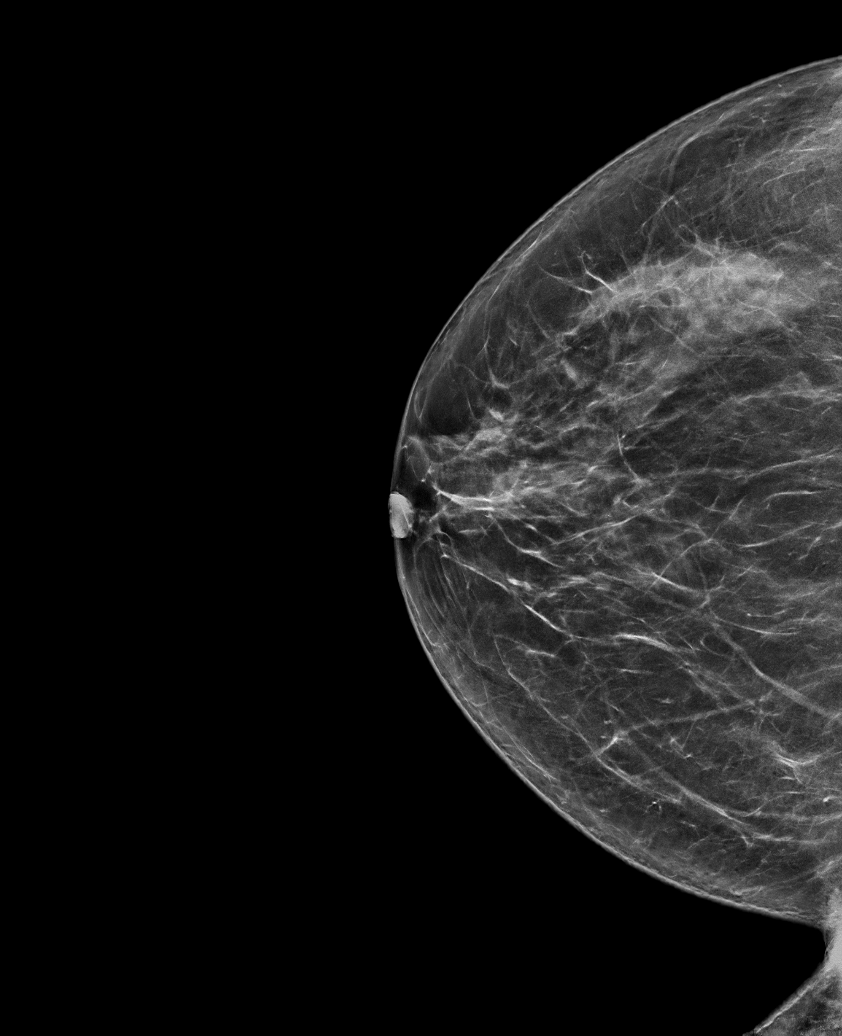

[6 of 36 positions shown; findings below may reference images not displayed]

ACR Breast Density Category c: The breast tissue is heterogeneously
dense, which may obscure small masses.
FINDINGS: No concerning masses, calcifications or distortion identified within
either breast on mammography.

On physical exam, there is a small palpable abnormality within the
medial left breast.

Targeted ultrasound is performed, showing dense tissue without
suspicious mass within the left breast 10-11 o'clock position at the
site of palpable concern.
IMPRESSION: No mammographic evidence for malignancy.

RECOMMENDATION:
Continued clinical evaluation for left breast palpable area of
concern.

Screening mammogram in one year.(Code:[id number])

I have discussed the findings and recommendations with the patient.
If applicable, a reminder letter will be sent to the patient
regarding the next appointment.

BI-RADS CATEGORY  1: Negative.

## 2023-11-27 ENCOUNTER — Telehealth: Admitting: Emergency Medicine

## 2023-11-27 DIAGNOSIS — R3989 Other symptoms and signs involving the genitourinary system: Secondary | ICD-10-CM

## 2023-11-27 MED ORDER — CEPHALEXIN 500 MG PO CAPS: 500.0000 mg | ORAL_CAPSULE | Freq: Two times a day (BID) | ORAL | 0 refills | Status: AC

## 2023-11-27 NOTE — Progress Notes (Signed)

## 2023-12-27 ENCOUNTER — Telehealth: Admitting: Family Medicine

## 2023-12-27 DIAGNOSIS — J069 Acute upper respiratory infection, unspecified: Secondary | ICD-10-CM | POA: Diagnosis not present

## 2023-12-27 MED ORDER — BENZONATATE 100 MG PO CAPS: 100.0000 mg | ORAL_CAPSULE | Freq: Three times a day (TID) | ORAL | 0 refills | Status: DC | PRN

## 2023-12-27 NOTE — Progress Notes (Signed)
 E visit for Flu like symptoms   We are sorry that you are not feeling well.  Here is how we plan to help! Based on what you have shared with me it looks like you may have flu-like symptoms that should be watched but do not seem to indicate anti-viral treatment.  Influenza or the flu is  an infection caused by a respiratory virus. The flu virus is highly contagious and persons who did not receive their yearly flu vaccination may catch the flu from close contact.  For nasal congestion, you may use an oral decongestant such as Mucinex D or if you have glaucoma or high blood pressure use plain Mucinex.  Saline nasal spray or nasal drops can help and can safely be used as often as needed for congestion.  If you have a sore or scratchy throat, use a saltwater gargle-  to  teaspoon of salt dissolved in a 4-ounce to 8-ounce glass of warm water.  Gargle the solution for approximately 15-30 seconds and then spit.  It is important not to swallow the solution.  You can also use throat lozenges/cough drops and Chloraseptic spray to help with throat pain or discomfort.  Warm or cold liquids can also be helpful in relieving throat pain.  For headache, pain or general discomfort, you can use Ibuprofen or Tylenol  as directed.   Some authorities believe that zinc sprays or the use of Echinacea may shorten the course of your symptoms.  I have prescribed the following medications to help lessen symptoms: I have prescribed Tessalon  Perles 100 mg. You may take 1-2 capsules every 8 hours as needed for cough  You are to isolate at home until you have been fever-free for at least 24 hours without a fever-reducing medication, and symptoms have been steadily improving for 24 hours.  If you must be around other household members who do not have symptoms, you need to make sure that both you and the family members are masking consistently with a high-quality mask.  If you note any worsening of symptoms despite treatment,  please seek an in-person evaluation ASAP. If you note any significant shortness of breath or any chest pain, please seek ED evaluation. Please do not delay care!  ANYONE WHO HAS FLU SYMPTOMS SHOULD: Stay home. The flu is highly contagious and going out or to work exposes others! Be sure to drink plenty of fluids. Water is fine as well as fruit juices, sodas and electrolyte beverages. You may want to stay away from caffeine or alcohol. If you are nauseated, try taking small sips of liquids. How do you know if you are getting enough fluid? Your urine should be a pale yellow or almost colorless. Get rest. Taking a steamy shower or using a humidifier may help nasal congestion and ease sore throat pain. Using a saline nasal spray works much the same way. Cough drops, hard candies and sore throat lozenges may ease your cough. Line up a caregiver. Have someone check on you regularly.  GET HELP RIGHT AWAY IF: You cannot keep down liquids or your medications. You become short of breath Your fell like you are going to pass out or loose consciousness. Your symptoms persist after you have completed your treatment plan  MAKE SURE YOU  Understand these instructions. Will watch your condition. Will get help right away if you are not doing well or get worse.  Your e-visit answers were reviewed by a board certified advanced clinical practitioner to complete your personal care plan.  Depending on the condition, your plan could have included both over the counter or prescription medications.  If there is a problem please reply  once you have received a response from your provider.  Your safety is important to us .  If you have drug allergies check your prescription carefully.    You can use MyChart to ask questions about todays visit, request a non-urgent call back, or ask for a work or school excuse for 24 hours related to this e-Visit. If it has been greater than 24 hours you will need to follow up with  your provider, or enter a new e-Visit to address those concerns.  You will get an e-mail in the next two days asking about your experience.  I hope that your e-visit has been valuable and will speed your recovery. Thank you for using e-visits.   I have spent 5 minutes in review of e-visit questionnaire, review and updating patient chart, medical decision making and response to patient.   Chiquita CHRISTELLA Barefoot, NP

## 2024-01-10 ENCOUNTER — Encounter: Payer: Self-pay | Admitting: Family Medicine

## 2024-01-16 ENCOUNTER — Ambulatory Visit: Admitting: Family Medicine

## 2024-01-16 ENCOUNTER — Encounter: Payer: Self-pay | Admitting: Family Medicine

## 2024-01-16 VITALS — BP 150/94 | HR 75 | Resp 16 | Ht 68.0 in | Wt 240.5 lb

## 2024-01-16 DIAGNOSIS — I10 Essential (primary) hypertension: Secondary | ICD-10-CM

## 2024-01-16 DIAGNOSIS — E7800 Pure hypercholesterolemia, unspecified: Secondary | ICD-10-CM

## 2024-01-16 DIAGNOSIS — R809 Proteinuria, unspecified: Secondary | ICD-10-CM | POA: Diagnosis not present

## 2024-01-16 DIAGNOSIS — R7989 Other specified abnormal findings of blood chemistry: Secondary | ICD-10-CM

## 2024-01-16 DIAGNOSIS — R7303 Prediabetes: Secondary | ICD-10-CM | POA: Diagnosis not present

## 2024-01-16 MED ORDER — AMLODIPINE BESYLATE 10 MG PO TABS: 10.0000 mg | ORAL_TABLET | Freq: Every day | ORAL | 1 refills | Status: AC

## 2024-01-16 MED ORDER — TRIAMTERENE-HCTZ 37.5-25 MG PO TABS: 1.0000 | ORAL_TABLET | Freq: Every day | ORAL | 0 refills | Status: AC

## 2024-01-16 MED ORDER — METFORMIN HCL ER 500 MG PO TB24: 500.0000 mg | ORAL_TABLET | Freq: Every day | ORAL | 0 refills | Status: AC

## 2024-01-16 MED ORDER — ROSUVASTATIN CALCIUM 5 MG PO TABS: 5.0000 mg | ORAL_TABLET | Freq: Every day | ORAL | 1 refills | Status: AC

## 2024-01-16 NOTE — Progress Notes (Signed)
 Name: Cristina Bridges   MRN: [id number]    DOB: June 13, 1970   Date:01/16/2024       Progress Note  Subjective  Chief Complaint  Chief Complaint  Patient presents with   Medical Management of Chronic Issues   Discussed the use of AI scribe software for clinical note transcription with the patient, who gave verbal consent to proceed.  History of Present Illness Cristina Bridges is a 54 year old female with hypertension who presents with concerns about uncontrolled blood pressure.  She has a history of hypertension with blood pressure readings consistently in the 150s/90s range, with recent readings of 156/96 and 150/94. She is currently taking amlodipine  10 mg and Maxzide, but her blood pressure remains uncontrolled. Her renin-aldosterone ratio is abnormal. She also has microalbuminuria   She has experienced weight gain, currently weighing 240 lbs with a BMI of 36.57. She previously used Ozempic , which reduced her weight to 226 lbs, but discontinued it due to cost. She struggles with meal planning and often consumes fast food. She is considering improving her diet and physical activity.  She has prediabetes and is not consistently taking metformin  due to its size and side effects. No frequent urination during the day, but increased urination at night. She does not snore but is aware of risk factors and the fact she may have OSA but cannot afford getting tested at this time.   She is on rosuvastatin  for hypercholesterolemia and takes her cholesterol medication regularly. She has proteinuria, indicating possible kidney involvement, and her cholesterol levels require monitoring.    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   Pre-diabetes 05/10/2023   Microalbuminuria 04/02/2018   Morbid obesity (HCC) 03/28/2018   Postmenopausal 05/23/2017   Hot flashes, menopausal 03/03/2016   Acne vulgaris 09/24/2014    Uncontrolled hypertension 04/07/2014   Body mass index (BMI) of 38.0-38.9 in adult 04/07/2014   Mixed hyperlipidemia 04/07/2014    Past Surgical History:  Procedure Laterality Date   CESAREAN SECTION  05/29/2000   Age-6    Family History  Problem Relation Age of Onset   Diabetes Mellitus II Mother    Hypertension Mother    Bone cancer Father        Remission   Prostate cancer Father        Remission   Hypertension Sister    Cancer Paternal Grandmother    Cancer Paternal Grandfather        Maybe Lung    Social History   Tobacco Use   Smoking status: Never   Smokeless tobacco: Never  Substance Use Topics   Alcohol use: Not Currently    Alcohol/week: 1.0 standard drink of alcohol    Types: 1 Glasses of wine per week    Comment: occasionally    Current Medications[1]  Allergies[2]  I personally reviewed active problem list, medication list, allergies with the patient/caregiver today.   ROS  Ten systems reviewed and is negative except as mentioned in HPI    Objective Physical Exam  CONSTITUTIONAL: Patient appears well-developed and well-nourished.  No distress. HEENT: Head atraumatic, normocephalic, neck supple. CARDIOVASCULAR: Normal rate, regular rhythm and normal heart sounds.  No murmur heard. No BLE edema. PULMONARY: Effort normal and breath sounds normal. No respiratory distress. ABDOMINAL: There is no tenderness or distention. MUSCULOSKELETAL: Normal gait. Without gross motor or sensory deficit. PSYCHIATRIC: Patient has a normal mood and affect. behavior is  normal. Judgment and thought content normal.  Vitals:   01/16/24 0803 01/16/24 0817  BP: (!) 156/96 (!) 150/94  Pulse: 75   Resp: 16   SpO2: 95%   Weight: 240 lb 8 oz (109.1 kg)   Height: 5' 8 (1.727 m)     Body mass index is 36.57 kg/m.    PHQ2/9:    01/16/2024    7:59 AM 10/11/2023    1:30 PM 05/10/2023    1:19 PM 02/10/2023    1:17 PM 02/21/2022    2:36 PM  Depression screen PHQ  2/9  Decreased Interest 0 0 0 0 0  Down, Depressed, Hopeless 0 0 0 0 0  PHQ - 2 Score 0 0 0 0 0  Altered sleeping   0 0 0  Tired, decreased energy   0 0 1  Change in appetite   0 0 0  Feeling bad or failure about yourself    0 0 0  Trouble concentrating   0 0 1  Moving slowly or fidgety/restless   0 0 0  Suicidal thoughts   0 0 0  PHQ-9 Score   0  0  2   Difficult doing work/chores   Not difficult at all Not difficult at all      Data saved with a previous flowsheet row definition    phq 9 is negative  Fall Risk:    01/16/2024    7:59 AM 10/11/2023    1:30 PM 02/10/2023    1:17 PM 02/21/2022    2:36 PM 12/31/2021    8:15 AM  Fall Risk   Falls in the past year? 0 0 0 0 0  Number falls in past yr: 0 0 0 0 0  Injury with Fall? 0 0  0  0  0   Risk for fall due to : No Fall Risks No Fall Risks No Fall Risks No Fall Risks No Fall Risks  Follow up Falls evaluation completed Falls evaluation completed Falls prevention discussed;Education provided;Falls evaluation completed Falls prevention discussed Falls prevention discussed;Education provided;Falls evaluation completed      Data saved with a previous flowsheet row definition    Assessment & Plan Secondary hypertension Chronic hypertension with suspected secondary causes due to abnormal renin-aldosterone ratio. Differential includes adrenal gland issues or hyperthyroidism. Current medications include amlodipine  and another antihypertensive, but blood pressure remains uncontrolled. Discussed potential adrenal gland adenomas and surgical intervention. Emphasized addressing secondary causes to prevent complications. - Referred to nephrologist for further evaluation and management. - Ordered comprehensive metabolic panel and blood work. - Ordered renin-aldosterone ratio test. - Discussed potential for CT adrenal gland if financially feasible.  Morbid obesity BMI 36.57. Weight gain noted. Previous use of Ozempic  resulted in some weight  loss, but insurance no longer covers it. Discussed challenges with meal planning and fast food impact. Introduced [employer] for meal planning. - Discussed Macro Depot as a meal planning option. - Encouraged lifestyle modifications including increased physical activity and reduced fast food consumption.  Prediabetes Symptoms of increased thirst or urination. Previously prescribed metformin , but adherence inconsistent due to side effects. Discussed benefits of metformin  for weight loss and glycemic control. - Continue metformin  as previously prescribed, with option to discontinue if not tolerated.  Proteinuria Presence of proteinuria indicating potential kidney involvement. Discussed importance of evaluating kidney function and hypertension impact on kidney health. - Ordered comprehensive metabolic panel and blood work to assess kidney function.  Hypercholesterolemia Managed with rosuvastatin . Discussed importance of cholesterol  management for cardiovascular risk. - Continue rosuvastatin  as prescribed.  General health maintenance Discussed importance of flu vaccination and routine health maintenance. - Ensure flu vaccination is up to date.        [1]  Current Outpatient Medications:    amLODipine  (NORVASC ) 10 MG tablet, Take 1 tablet (10 mg total) by mouth daily., Disp: 90 tablet, Rfl: 1   metFORMIN  (GLUCOPHAGE -XR) 500 MG 24 hr tablet, Take 1 tablet (500 mg total) by mouth daily with breakfast., Disp: 90 tablet, Rfl: 0   rosuvastatin  (CRESTOR ) 5 MG tablet, Take 1 tablet (5 mg total) by mouth daily., Disp: 90 tablet, Rfl: 1   triamterene -hydrochlorothiazide (MAXZIDE-25) 37.5-25 MG tablet, Take 1 tablet by mouth daily., Disp: 90 tablet, Rfl: 0 [2] No Known Allergies

## 2024-01-18 ENCOUNTER — Ambulatory Visit: Payer: Self-pay | Admitting: Family Medicine

## 2024-01-21 LAB — LIPID PANEL
Cholesterol: 186 mg/dL
HDL: 57 mg/dL
LDL Cholesterol (Calc): 110 mg/dL — ABNORMAL HIGH
Non-HDL Cholesterol (Calc): 129 mg/dL
Total CHOL/HDL Ratio: 3.3 (calc)
Triglycerides: 99 mg/dL

## 2024-01-21 LAB — CBC WITH DIFFERENTIAL/PLATELET
Absolute Lymphocytes: 2007 {cells}/uL (ref 850–3900)
Absolute Monocytes: 494 {cells}/uL (ref 200–950)
Basophils Absolute: 19 {cells}/uL (ref 0–200)
Basophils Relative: 0.4 %
Eosinophils Absolute: 122 {cells}/uL (ref 15–500)
Eosinophils Relative: 2.6 %
HCT: 42.9 % (ref 35.9–46.0)
Hemoglobin: 13.8 g/dL (ref 11.7–15.5)
MCH: 29.5 pg (ref 27.0–33.0)
MCHC: 32.2 g/dL (ref 31.6–35.4)
MCV: 91.7 fL (ref 81.4–101.7)
MPV: 11.2 fL (ref 7.5–12.5)
Monocytes Relative: 10.5 %
Neutro Abs: 2059 {cells}/uL (ref 1500–7800)
Neutrophils Relative %: 43.8 %
Platelets: 363 Thousand/uL (ref 140–400)
RBC: 4.68 Million/uL (ref 3.80–5.10)
RDW: 13.1 % (ref 11.0–15.0)
Total Lymphocyte: 42.7 %
WBC: 4.7 Thousand/uL (ref 3.8–10.8)

## 2024-01-21 LAB — HEMOGLOBIN A1C
Hgb A1c MFr Bld: 5.6 %
Mean Plasma Glucose: 114 mg/dL
eAG (mmol/L): 6.3 mmol/L

## 2024-01-21 LAB — ALDOSTERONE + RENIN ACTIVITY W/ RATIO
ALDO / PRA Ratio: 28.6 ratio (ref 0.9–28.9)
Aldosterone: 8 ng/dL
Renin Activity: 0.28 ng/mL/h (ref 0.25–5.82)

## 2024-01-21 LAB — COMPREHENSIVE METABOLIC PANEL WITH GFR
AG Ratio: 1.7 (calc) (ref 1.0–2.5)
ALT: 19 U/L (ref 6–29)
AST: 18 U/L (ref 10–35)
Albumin: 4.4 g/dL (ref 3.6–5.1)
Alkaline phosphatase (APISO): 80 U/L (ref 37–153)
BUN: 11 mg/dL (ref 7–25)
CO2: 27 mmol/L (ref 20–32)
Calcium: 9.4 mg/dL (ref 8.6–10.4)
Chloride: 104 mmol/L (ref 98–110)
Creat: 0.66 mg/dL (ref 0.50–1.03)
Globulin: 2.6 g/dL (ref 1.9–3.7)
Glucose, Bld: 95 mg/dL (ref 65–99)
Potassium: 4.6 mmol/L (ref 3.5–5.3)
Sodium: 140 mmol/L (ref 135–146)
Total Bilirubin: 0.8 mg/dL (ref 0.2–1.2)
Total Protein: 7 g/dL (ref 6.1–8.1)
eGFR: 104 mL/min/1.73m2

## 2024-01-21 LAB — MICROALBUMIN / CREATININE URINE RATIO
Creatinine, Urine: 116 mg/dL (ref 20–275)
Microalb Creat Ratio: 44 mg/g{creat} — ABNORMAL HIGH
Microalb, Ur: 5.1 mg/dL

## 2024-01-23 ENCOUNTER — Other Ambulatory Visit: Payer: Self-pay | Admitting: Family Medicine

## 2024-01-23 DIAGNOSIS — I10 Essential (primary) hypertension: Secondary | ICD-10-CM

## 2024-02-07 ENCOUNTER — Encounter: Admitting: Family Medicine

## 2024-02-19 ENCOUNTER — Encounter: Admitting: Family Medicine

## 2024-03-06 ENCOUNTER — Encounter: Admitting: Family Medicine
